# Patient Record
Sex: Male | Born: 1950 | Race: White | Hispanic: No | Marital: Single | Smoking: Former smoker
Health system: Southern US, Community
[De-identification: ages and names within clinical notes are randomized; demographics above are authoritative.]

## PROBLEM LIST (undated history)

## (undated) DIAGNOSIS — F0390 Unspecified dementia without behavioral disturbance: Secondary | ICD-10-CM

## (undated) DIAGNOSIS — I10 Essential (primary) hypertension: Secondary | ICD-10-CM

## (undated) DIAGNOSIS — I4892 Unspecified atrial flutter: Secondary | ICD-10-CM

## (undated) DIAGNOSIS — E119 Type 2 diabetes mellitus without complications: Secondary | ICD-10-CM

## (undated) DIAGNOSIS — F419 Anxiety disorder, unspecified: Secondary | ICD-10-CM

## (undated) DIAGNOSIS — G4700 Insomnia, unspecified: Secondary | ICD-10-CM

## (undated) DIAGNOSIS — C4491 Basal cell carcinoma of skin, unspecified: Secondary | ICD-10-CM

## (undated) DIAGNOSIS — R41841 Cognitive communication deficit: Secondary | ICD-10-CM

## (undated) DIAGNOSIS — I2510 Atherosclerotic heart disease of native coronary artery without angina pectoris: Secondary | ICD-10-CM

---

## 2013-01-13 ENCOUNTER — Other Ambulatory Visit: Payer: Self-pay

## 2013-01-13 LAB — URINALYSIS, COMPLETE
Bilirubin,UR: NEGATIVE
Blood: NEGATIVE
Glucose,UR: 150 mg/dL (ref 0–75)
Ketone: NEGATIVE
Nitrite: NEGATIVE
RBC,UR: 2 /HPF (ref 0–5)
Specific Gravity: 1.019 (ref 1.003–1.030)
WBC UR: 3 /HPF (ref 0–5)

## 2013-01-14 LAB — URINE CULTURE

## 2013-06-29 ENCOUNTER — Other Ambulatory Visit: Payer: Self-pay | Admitting: Family Medicine

## 2013-06-29 LAB — COMPREHENSIVE METABOLIC PANEL
Albumin: 3.2 g/dL — ABNORMAL LOW (ref 3.4–5.0)
BUN: 21 mg/dL — ABNORMAL HIGH (ref 7–18)
Calcium, Total: 8.8 mg/dL (ref 8.5–10.1)
Co2: 28 mmol/L (ref 21–32)
Creatinine: 1.21 mg/dL (ref 0.60–1.30)
EGFR (Non-African Amer.): >60
Osmolality: 288 (ref 275–301)
Potassium: 3.8 mmol/L (ref 3.5–5.1)
SGOT(AST): 17 U/L (ref 15–37)
Sodium: 141 mmol/L (ref 136–145)
Total Protein: 6.2 g/dL — ABNORMAL LOW (ref 6.4–8.2)

## 2013-06-29 LAB — CBC WITH DIFFERENTIAL/PLATELET
Basophil #: 0.1 10*3/uL (ref 0.0–0.1)
Eosinophil %: 2.4 %
HCT: 39.4 % — ABNORMAL LOW (ref 40.0–52.0)
HGB: 13.4 g/dL (ref 13.0–18.0)
Lymphocyte #: 2.8 10*3/uL (ref 1.0–3.6)
Lymphocyte %: 29.2 %
MCH: 31.4 pg (ref 26.0–34.0)
MCHC: 33.9 g/dL (ref 32.0–36.0)
MCV: 93 fL (ref 80–100)
Monocyte #: 1.3 x10 3/mm — ABNORMAL HIGH (ref 0.2–1.0)
Neutrophil #: 5.1 10*3/uL (ref 1.4–6.5)
Platelet: 230 10*3/uL (ref 150–440)
RBC: 4.26 10*6/uL — ABNORMAL LOW (ref 4.40–5.90)
RDW: 15.2 % — ABNORMAL HIGH (ref 11.5–14.5)
WBC: 9.5 10*3/uL (ref 3.8–10.6)

## 2013-06-29 LAB — PROTIME-INR: INR: 1

## 2015-07-05 ENCOUNTER — Other Ambulatory Visit
Admission: RE | Admit: 2015-07-05 | Discharge: 2015-07-05 | Disposition: A | Discharge status: Home / Self Care | Payer: Medicare Other | Source: Ambulatory Visit | Attending: Physician Assistant | Admitting: Physician Assistant

## 2015-07-05 DIAGNOSIS — R8290 Unspecified abnormal findings in urine: Secondary | ICD-10-CM | POA: Insufficient documentation

## 2015-07-05 DIAGNOSIS — I10 Essential (primary) hypertension: Secondary | ICD-10-CM | POA: Diagnosis not present

## 2015-07-05 DIAGNOSIS — Q245 Malformation of coronary vessels: Secondary | ICD-10-CM | POA: Insufficient documentation

## 2015-07-05 DIAGNOSIS — E119 Type 2 diabetes mellitus without complications: Secondary | ICD-10-CM | POA: Insufficient documentation

## 2015-07-05 DIAGNOSIS — F0150 Vascular dementia without behavioral disturbance: Secondary | ICD-10-CM | POA: Insufficient documentation

## 2015-07-05 LAB — URINALYSIS COMPLETE WITH MICROSCOPIC (ARMC ONLY)
Bilirubin Urine: NEGATIVE
Glucose, UA: NEGATIVE mg/dL
Hgb urine dipstick: NEGATIVE
Ketones, ur: NEGATIVE mg/dL
Nitrite: NEGATIVE
PROTEIN: NEGATIVE mg/dL
Specific Gravity, Urine: 1.016 (ref 1.005–1.030)
Squamous Epithelial / LPF: NONE SEEN
pH: 6 (ref 5.0–8.0)

## 2015-07-07 LAB — URINE CULTURE: Culture: >=100000

## 2015-08-31 ENCOUNTER — Other Ambulatory Visit
Admission: RE | Admit: 2015-08-31 | Discharge: 2015-08-31 | Disposition: A | Discharge status: Home / Self Care | Payer: Medicare Other | Source: Ambulatory Visit | Attending: Family Medicine | Admitting: Family Medicine

## 2015-08-31 DIAGNOSIS — N390 Urinary tract infection, site not specified: Secondary | ICD-10-CM | POA: Insufficient documentation

## 2015-08-31 LAB — URINALYSIS COMPLETE WITH MICROSCOPIC (ARMC ONLY)
BILIRUBIN URINE: NEGATIVE
Glucose, UA: NEGATIVE mg/dL
KETONES UR: NEGATIVE mg/dL
NITRITE: POSITIVE — AB
PROTEIN: NEGATIVE mg/dL
SPECIFIC GRAVITY, URINE: 1.017 (ref 1.005–1.030)
pH: 6 (ref 5.0–8.0)

## 2015-09-02 LAB — URINE CULTURE: Culture: >=100000

## 2016-02-18 ENCOUNTER — Emergency Department: Payer: Medicare Other

## 2016-02-18 ENCOUNTER — Emergency Department
Admission: EM | Admit: 2016-02-18 | Discharge: 2016-02-18 | Disposition: A | Discharge status: Skilled Nursing Facility | Payer: Medicare Other | Attending: Emergency Medicine | Admitting: Emergency Medicine

## 2016-02-18 DIAGNOSIS — Y939 Activity, unspecified: Secondary | ICD-10-CM | POA: Diagnosis not present

## 2016-02-18 DIAGNOSIS — S5011XA Contusion of right forearm, initial encounter: Secondary | ICD-10-CM | POA: Insufficient documentation

## 2016-02-18 DIAGNOSIS — W19XXXA Unspecified fall, initial encounter: Secondary | ICD-10-CM | POA: Insufficient documentation

## 2016-02-18 DIAGNOSIS — S51811A Laceration without foreign body of right forearm, initial encounter: Secondary | ICD-10-CM | POA: Diagnosis present

## 2016-02-18 DIAGNOSIS — Y999 Unspecified external cause status: Secondary | ICD-10-CM | POA: Insufficient documentation

## 2016-02-18 DIAGNOSIS — Y92128 Other place in nursing home as the place of occurrence of the external cause: Secondary | ICD-10-CM | POA: Insufficient documentation

## 2016-02-18 NOTE — ED Notes (Signed)
Ems brought pt in after fall at the nursing home - the nursing home reports that pt fell and hit right arm on a dresser causing a large skin tear to right fa that they felt needed to be evaluated - MD Jimmye Norman unwrapped and  assessed area - requested it be rewrapped with and pt be xrayed

## 2016-02-18 NOTE — ED Notes (Signed)
Per ems the nursing home ([facility]) reported that pt fell and hit right arm on dresser causing a "significant" skin tear that the nurse wanted to be evaluated - the rt fa is bandaged and no drainage or blood is noted on the outer aspect of dressing - discussed with physician here and will leave area dressed until he is ready to evaluate

## 2016-02-18 NOTE — Discharge Instructions (Signed)
°  Skin Tear Care A skin tear is a wound in which the top layer of skin has peeled off. This is a common problem with aging because the skin becomes thinner and more fragile as a person gets older. In addition, some medicines, such as oral corticosteroids, can lead to skin thinning if taken for long periods of time.  A skin tear is often repaired with tape or skin adhesive strips. This keeps the skin that has been peeled off in contact with the healthier skin beneath. Depending on the location of the wound, a bandage (dressing) may be applied over the tape or skin adhesive strips. Sometimes, during the healing process, the skin turns black and dies. Even when this happens, the torn skin acts as a good dressing until the skin underneath gets healthier and repairs itself. HOME CARE INSTRUCTIONS   Change dressings once per day or as directed by your caregiver.  Gently clean the skin tear and the area around the tear using saline solution or mild soap and water.  Do not rub the injured skin dry. Let the area air dry.  Apply petroleum jelly or an antibiotic cream or ointment to keep the tear moist. This will help the wound heal. Do not allow a scab to form.  If the dressing sticks before the next dressing change, moisten it with warm soapy water and gently remove it.  Protect the injured skin until it has healed.  Only take over-the-counter or prescription medicines as directed by your caregiver.  Take showers or baths using warm soapy water. Apply a new dressing after the shower or bath.  Keep all follow-up appointments as directed by your caregiver.  SEEK IMMEDIATE MEDICAL CARE IF:   You have redness, swelling, or increasing pain in the skin tear.  You havepus coming from the skin tear.  You have chills.  You have a red streak that goes away from the skin tear.  You have a bad smell coming from the tear or dressing.  You have a fever or persistent symptoms for more than 2-3  days.  You have a fever and your symptoms suddenly get worse. MAKE SURE YOU:  Understand these instructions.  Will watch this condition.  Will get help right away if your child is not doing well or gets worse.   This information is not intended to replace advice given to you by your health care provider. Make sure you discuss any questions you have with your health care provider.   Document Released: 07/13/2001 Document Revised: 07/12/2012 Document Reviewed: 05/01/2012 Elsevier Interactive Patient Education [employer].

## 2016-02-18 NOTE — ED Notes (Signed)
[facility]ard secretary notified that pt is ready to return to [facility]

## 2016-02-18 NOTE — ED Provider Notes (Addendum)
[facility] Emergency Department Provider Note     Time seen: ----------------------------------------- 6:09 PM on 02/18/2016 -----------------------------------------  L5 caveat: Review of systems and history are limited by dementia  I have reviewed the triage vital signs and the nursing notes.   HISTORY  Chief Complaint Fall    HPI Joel Davis is a 65 y.o. male who presents ER after he fell on his right arm on a dresser at the nursing home. Patient was noted to have a significant skin tear in the right forearm and the nurse wanted to be evaluated. Patient denies any complaints, is demented.   No past medical history on file.  There are no active problems to display for this patient.   No past surgical history on file.  Allergies Review of patient's allergies indicates not on file.  Social History Social History  Substance Use Topics  . Smoking status: Not on file  . Smokeless tobacco: Not on file  . Alcohol Use: Not on file    Review of Systems Unknown, patient denies complaints  ____________________________________________   PHYSICAL EXAM:  VITAL SIGNS: ED Triage Vitals  Enc Vitals Group     BP 02/18/16 1805 170/70 mmHg     Pulse Rate 02/18/16 1805 72     Resp 02/18/16 1805 20     Temp 02/18/16 1805 97.8 F (36.6 C)     Temp Source 02/18/16 1805 Oral     SpO2 02/18/16 1805 99 %     Weight 02/18/16 1805 160 lb (72.576 kg)     Height 02/18/16 1805 5\' 11"  (1.803 m)     Head Cir --      Peak Flow --      Pain Score --      Pain Loc --      Pain Edu? --      Excl. in [place]? --    Constitutional: Alert, and in no distress. Eyes: Conjunctivae are normal. Normal extraocular movements. ENT   Head: Normocephalic and atraumatic.   Nose: No congestion/rhinnorhea.   Mouth/Throat: Mucous membranes are moist.   Neck: No stridor. Cardiovascular: Normal rate, regular rhythm. No murmurs, rubs, or gallops. Respiratory:  Normal respiratory effort without tachypnea nor retractions. Breath sounds are clear and equal bilaterally. No wheezes/rales/rhonchi. Musculoskeletal: Ecchymosis is noted over the dorsal aspect of the right forearm, extensive skin tears noted over the dorsomedial right forearm. Neurologic: No gross focal neurologic deficits are appreciated.  Skin:  Skin tear and ecchymosis is noted over the right forearm ____________________________________________  ED COURSE:  Pertinent labs & imaging results that were available during my care of the patient were reviewed by me and considered in my medical decision making (see chart for details). Patient is in no acute distress, Xeroform dressing was applied. We will obtain x-rays and likely discharged to return to the nursing home. ____________________________________________    RADIOLOGY Images were viewed by me  Right forearm x-rays are unremarkable  IMPRESSION: No acute bony findings or radiopaque foreign body. ____________________________________________  FINAL ASSESSMENT AND PLAN  Fall, contusion, skin tear  Plan: Patient with imaging as dictated above. Patient is in no acute distress, a wound dressing was applied. X-rays were negative as dictated above. He stable for discharge.   Earleen Newport, MD   Earleen Newport, MD 02/18/16 1812  Earleen Newport, MD 02/18/16 [phone]

## 2016-12-06 ENCOUNTER — Other Ambulatory Visit
Admission: RE | Admit: 2016-12-06 | Discharge: 2016-12-06 | Disposition: A | Discharge status: Home / Self Care | Payer: Self-pay | Source: Ambulatory Visit | Attending: Family Medicine | Admitting: Family Medicine

## 2016-12-06 DIAGNOSIS — I10 Essential (primary) hypertension: Secondary | ICD-10-CM | POA: Insufficient documentation

## 2016-12-06 DIAGNOSIS — R531 Weakness: Secondary | ICD-10-CM | POA: Insufficient documentation

## 2016-12-06 LAB — CBC WITH DIFFERENTIAL/PLATELET
BASOS ABS: 0.1 10*3/uL (ref 0–0.1)
Basophils Relative: 1 %
EOS ABS: 0 10*3/uL (ref 0–0.7)
Eosinophils Relative: 0 %
HCT: 41 % (ref 40.0–52.0)
Hemoglobin: 13.5 g/dL (ref 13.0–18.0)
LYMPHS ABS: 3.2 10*3/uL (ref 1.0–3.6)
LYMPHS PCT: 24 %
MCH: 31.6 pg (ref 26.0–34.0)
MCHC: 32.9 g/dL (ref 32.0–36.0)
MCV: 96.2 fL (ref 80.0–100.0)
MONO ABS: 1.5 10*3/uL — AB (ref 0.2–1.0)
Monocytes Relative: 11 %
NEUTROS ABS: 8.4 10*3/uL — AB (ref 1.4–6.5)
Neutrophils Relative %: 64 %
PLATELETS: 310 10*3/uL (ref 150–440)
RBC: 4.26 MIL/uL — ABNORMAL LOW (ref 4.40–5.90)
RDW: 14.9 % — ABNORMAL HIGH (ref 11.5–14.5)
Smear Review: ADEQUATE
WBC: 13.2 10*3/uL — ABNORMAL HIGH (ref 3.8–10.6)

## 2016-12-06 LAB — COMPREHENSIVE METABOLIC PANEL
ALT: 13 U/L — AB (ref 17–63)
AST: 31 U/L (ref 15–41)
Albumin: 3.6 g/dL (ref 3.5–5.0)
Alkaline Phosphatase: 72 U/L (ref 38–126)
Anion gap: 12 (ref 5–15)
BUN: 40 mg/dL — ABNORMAL HIGH (ref 6–20)
CHLORIDE: 109 mmol/L (ref 101–111)
CO2: 22 mmol/L (ref 22–32)
CREATININE: 1.84 mg/dL — AB (ref 0.61–1.24)
Calcium: 9.4 mg/dL (ref 8.9–10.3)
GFR, EST AFRICAN AMERICAN: 42 mL/min — AB (ref >60)
GFR, EST NON AFRICAN AMERICAN: 36 mL/min — AB (ref >60)
Glucose, Bld: 145 mg/dL — ABNORMAL HIGH (ref 65–99)
Potassium: 4.4 mmol/L (ref 3.5–5.1)
SODIUM: 143 mmol/L (ref 135–145)
Total Bilirubin: 0.4 mg/dL (ref 0.3–1.2)
Total Protein: 7 g/dL (ref 6.5–8.1)

## 2016-12-16 ENCOUNTER — Encounter: Payer: Self-pay | Admitting: Emergency Medicine

## 2016-12-16 ENCOUNTER — Inpatient Hospital Stay
Admission: EM | Admit: 2016-12-16 | Discharge: 2016-12-19 | DRG: 689 | Disposition: A | Discharge status: Skilled Nursing Facility | Payer: Medicare Other | Attending: Specialist | Admitting: Specialist

## 2016-12-16 ENCOUNTER — Emergency Department: Payer: Medicare Other

## 2016-12-16 DIAGNOSIS — F419 Anxiety disorder, unspecified: Secondary | ICD-10-CM | POA: Diagnosis present

## 2016-12-16 DIAGNOSIS — E119 Type 2 diabetes mellitus without complications: Secondary | ICD-10-CM | POA: Diagnosis present

## 2016-12-16 DIAGNOSIS — I2510 Atherosclerotic heart disease of native coronary artery without angina pectoris: Secondary | ICD-10-CM | POA: Diagnosis present

## 2016-12-16 DIAGNOSIS — F0150 Vascular dementia without behavioral disturbance: Secondary | ICD-10-CM | POA: Diagnosis present

## 2016-12-16 DIAGNOSIS — R4182 Altered mental status, unspecified: Secondary | ICD-10-CM | POA: Diagnosis not present

## 2016-12-16 DIAGNOSIS — N390 Urinary tract infection, site not specified: Principal | ICD-10-CM | POA: Diagnosis present

## 2016-12-16 DIAGNOSIS — B9620 Unspecified Escherichia coli [E. coli] as the cause of diseases classified elsewhere: Secondary | ICD-10-CM | POA: Diagnosis present

## 2016-12-16 DIAGNOSIS — Z79899 Other long term (current) drug therapy: Secondary | ICD-10-CM | POA: Diagnosis not present

## 2016-12-16 DIAGNOSIS — I10 Essential (primary) hypertension: Secondary | ICD-10-CM | POA: Diagnosis present

## 2016-12-16 DIAGNOSIS — N179 Acute kidney failure, unspecified: Secondary | ICD-10-CM | POA: Diagnosis present

## 2016-12-16 DIAGNOSIS — Z85828 Personal history of other malignant neoplasm of skin: Secondary | ICD-10-CM | POA: Diagnosis not present

## 2016-12-16 DIAGNOSIS — Z87891 Personal history of nicotine dependence: Secondary | ICD-10-CM

## 2016-12-16 DIAGNOSIS — R41841 Cognitive communication deficit: Secondary | ICD-10-CM | POA: Diagnosis present

## 2016-12-16 DIAGNOSIS — E870 Hyperosmolality and hypernatremia: Secondary | ICD-10-CM | POA: Diagnosis present

## 2016-12-16 DIAGNOSIS — G4700 Insomnia, unspecified: Secondary | ICD-10-CM | POA: Diagnosis present

## 2016-12-16 DIAGNOSIS — M6281 Muscle weakness (generalized): Secondary | ICD-10-CM

## 2016-12-16 DIAGNOSIS — I4892 Unspecified atrial flutter: Secondary | ICD-10-CM | POA: Diagnosis present

## 2016-12-16 DIAGNOSIS — Z8673 Personal history of transient ischemic attack (TIA), and cerebral infarction without residual deficits: Secondary | ICD-10-CM | POA: Diagnosis not present

## 2016-12-16 DIAGNOSIS — E860 Dehydration: Secondary | ICD-10-CM | POA: Diagnosis present

## 2016-12-16 DIAGNOSIS — E785 Hyperlipidemia, unspecified: Secondary | ICD-10-CM | POA: Diagnosis present

## 2016-12-16 DIAGNOSIS — G9341 Metabolic encephalopathy: Secondary | ICD-10-CM | POA: Diagnosis present

## 2016-12-16 DIAGNOSIS — F329 Major depressive disorder, single episode, unspecified: Secondary | ICD-10-CM | POA: Diagnosis present

## 2016-12-16 DIAGNOSIS — Z7982 Long term (current) use of aspirin: Secondary | ICD-10-CM | POA: Diagnosis not present

## 2016-12-16 DIAGNOSIS — N342 Other urethritis: Secondary | ICD-10-CM

## 2016-12-16 HISTORY — DX: Unspecified atrial flutter: I48.92

## 2016-12-16 HISTORY — DX: Essential (primary) hypertension: I10

## 2016-12-16 HISTORY — DX: Insomnia, unspecified: G47.00

## 2016-12-16 HISTORY — DX: Cognitive communication deficit: R41.841

## 2016-12-16 HISTORY — DX: Basal cell carcinoma of skin, unspecified: C44.91

## 2016-12-16 HISTORY — DX: Anxiety disorder, unspecified: F41.9

## 2016-12-16 HISTORY — DX: Atherosclerotic heart disease of native coronary artery without angina pectoris: I25.10

## 2016-12-16 HISTORY — DX: Type 2 diabetes mellitus without complications: E11.9

## 2016-12-16 HISTORY — DX: Unspecified dementia, unspecified severity, without behavioral disturbance, psychotic disturbance, mood disturbance, and anxiety: F03.90

## 2016-12-16 LAB — URINALYSIS, COMPLETE (UACMP) WITH MICROSCOPIC
Bilirubin Urine: NEGATIVE
GLUCOSE, UA: NEGATIVE mg/dL
Ketones, ur: NEGATIVE mg/dL
NITRITE: POSITIVE — AB
Protein, ur: NEGATIVE mg/dL
Specific Gravity, Urine: 1.017 (ref 1.005–1.030)
pH: 5 (ref 5.0–8.0)

## 2016-12-16 LAB — CBC WITH DIFFERENTIAL/PLATELET
BASOS ABS: 0.1 10*3/uL (ref 0–0.1)
Basophils Relative: 1 %
Eosinophils Absolute: 0.3 10*3/uL (ref 0–0.7)
Eosinophils Relative: 3 %
HEMATOCRIT: 37.9 % — AB (ref 40.0–52.0)
Hemoglobin: 12.8 g/dL — ABNORMAL LOW (ref 13.0–18.0)
LYMPHS ABS: 2.5 10*3/uL (ref 1.0–3.6)
LYMPHS PCT: 22 %
MCH: 32.5 pg (ref 26.0–34.0)
MCHC: 33.7 g/dL (ref 32.0–36.0)
MCV: 96.3 fL (ref 80.0–100.0)
MONOS PCT: 12 %
Monocytes Absolute: 1.4 10*3/uL — ABNORMAL HIGH (ref 0.2–1.0)
NEUTROS ABS: 7.1 10*3/uL — AB (ref 1.4–6.5)
Neutrophils Relative %: 62 %
Platelets: 218 10*3/uL (ref 150–440)
RBC: 3.94 MIL/uL — ABNORMAL LOW (ref 4.40–5.90)
RDW: 15.1 % — AB (ref 11.5–14.5)
WBC: 11.4 10*3/uL — ABNORMAL HIGH (ref 3.8–10.6)

## 2016-12-16 LAB — COMPREHENSIVE METABOLIC PANEL
ALT: 11 U/L — ABNORMAL LOW (ref 17–63)
AST: 18 U/L (ref 15–41)
Albumin: 3.7 g/dL (ref 3.5–5.0)
Alkaline Phosphatase: 70 U/L (ref 38–126)
Anion gap: 8 (ref 5–15)
BILIRUBIN TOTAL: 0.5 mg/dL (ref 0.3–1.2)
BUN: 55 mg/dL — ABNORMAL HIGH (ref 6–20)
CO2: 26 mmol/L (ref 22–32)
Calcium: 9 mg/dL (ref 8.9–10.3)
Chloride: 112 mmol/L — ABNORMAL HIGH (ref 101–111)
Creatinine, Ser: 1.85 mg/dL — ABNORMAL HIGH (ref 0.61–1.24)
GFR calc Af Amer: 41 mL/min — ABNORMAL LOW (ref >60)
GFR, EST NON AFRICAN AMERICAN: 36 mL/min — AB (ref >60)
Glucose, Bld: 125 mg/dL — ABNORMAL HIGH (ref 65–99)
Potassium: 4.7 mmol/L (ref 3.5–5.1)
Sodium: 146 mmol/L — ABNORMAL HIGH (ref 135–145)
TOTAL PROTEIN: 7.2 g/dL (ref 6.5–8.1)

## 2016-12-16 LAB — PROTIME-INR
INR: 1.06
PROTHROMBIN TIME: 13.8 s (ref 11.4–15.2)

## 2016-12-16 MED ORDER — ENOXAPARIN SODIUM 40 MG/0.4ML ~~LOC~~ SOLN
40.0000 mg | SUBCUTANEOUS | Status: DC
  Administered 2016-12-16 – 2016-12-18 (×3): 40 mg via SUBCUTANEOUS
  Filled 2016-12-16 (×3): qty 0.4

## 2016-12-16 MED ORDER — SODIUM CHLORIDE 0.9 % IV BOLUS (SEPSIS)
500.0000 mL | Freq: Once | INTRAVENOUS | Status: AC
  Administered 2016-12-16: 500 mL via INTRAVENOUS

## 2016-12-16 MED ORDER — POLYETHYLENE GLYCOL 3350 17 G PO PACK: 17.0000 g | PACK | Freq: Every day | ORAL | Status: DC | PRN

## 2016-12-16 MED ORDER — ACETAMINOPHEN 650 MG RE SUPP: 650.0000 mg | Freq: Four times a day (QID) | RECTAL | Status: DC | PRN

## 2016-12-16 MED ORDER — ASPIRIN EC 81 MG PO TBEC
81.0000 mg | DELAYED_RELEASE_TABLET | Freq: Every day | ORAL | Status: DC
  Administered 2016-12-19: 10:00:00 81 mg via ORAL
  Filled 2016-12-16 (×3): qty 1

## 2016-12-16 MED ORDER — CYANOCOBALAMIN 500 MCG PO TABS
500.0000 ug | ORAL_TABLET | Freq: Every day | ORAL | Status: DC
  Administered 2016-12-19: 500 ug via ORAL
  Filled 2016-12-16 (×4): qty 1

## 2016-12-16 MED ORDER — SENNOSIDES-DOCUSATE SODIUM 8.6-50 MG PO TABS
1.0000 | ORAL_TABLET | Freq: Every day | ORAL | Status: DC
  Administered 2016-12-19: 1 via ORAL
  Filled 2016-12-16 (×3): qty 1

## 2016-12-16 MED ORDER — SODIUM CHLORIDE 0.9 % IV SOLN
INTRAVENOUS | Status: DC
  Administered 2016-12-16: 19:00:00 via INTRAVENOUS

## 2016-12-16 MED ORDER — METOPROLOL TARTRATE 25 MG PO TABS
25.0000 mg | ORAL_TABLET | Freq: Every day | ORAL | Status: DC
  Filled 2016-12-16 (×2): qty 1

## 2016-12-16 MED ORDER — ATORVASTATIN CALCIUM 20 MG PO TABS
80.0000 mg | ORAL_TABLET | Freq: Every evening | ORAL | Status: DC
  Filled 2016-12-16: qty 4

## 2016-12-16 MED ORDER — CITALOPRAM HYDROBROMIDE 20 MG PO TABS
20.0000 mg | ORAL_TABLET | Freq: Every day | ORAL | Status: DC
  Administered 2016-12-19: 20 mg via ORAL
  Filled 2016-12-16 (×3): qty 1

## 2016-12-16 MED ORDER — DEXTROSE 5 % IV SOLN: 1.0000 g | Freq: Once | INTRAVENOUS | Status: DC

## 2016-12-16 MED ORDER — DEXTROSE 5 % IV SOLN
1.0000 g | INTRAVENOUS | Status: DC
  Administered 2016-12-17 – 2016-12-19 (×3): 1 g via INTRAVENOUS
  Filled 2016-12-16 (×3): qty 10

## 2016-12-16 MED ORDER — ACETAMINOPHEN 325 MG PO TABS
650.0000 mg | ORAL_TABLET | Freq: Four times a day (QID) | ORAL | Status: DC | PRN
  Filled 2016-12-16: qty 2

## 2016-12-16 MED ORDER — CEFTRIAXONE SODIUM-DEXTROSE 1-3.74 GM-% IV SOLR
1.0000 g | Freq: Once | INTRAVENOUS | Status: AC
  Administered 2016-12-16: 1 g via INTRAVENOUS
  Filled 2016-12-16: qty 50

## 2016-12-16 MED ORDER — METOPROLOL TARTRATE 50 MG PO TABS
50.0000 mg | ORAL_TABLET | Freq: Every morning | ORAL | Status: DC
  Administered 2016-12-19: 50 mg via ORAL
  Filled 2016-12-16 (×3): qty 1

## 2016-12-16 MED ORDER — ONDANSETRON HCL 4 MG/2ML IJ SOLN: 4.0000 mg | Freq: Four times a day (QID) | INTRAMUSCULAR | Status: DC | PRN

## 2016-12-16 MED ORDER — ONDANSETRON HCL 4 MG PO TABS: 4.0000 mg | ORAL_TABLET | Freq: Four times a day (QID) | ORAL | Status: DC | PRN

## 2016-12-16 MED ORDER — OXYCODONE HCL 5 MG PO TABS: 5.0000 mg | ORAL_TABLET | ORAL | Status: DC | PRN

## 2016-12-16 NOTE — H&P (Signed)
[place] [facility] [place] NAME: Joel Davis    MR#:  [id number]  DATE OF BIRTH:  December 31, 1950  DATE OF ADMISSION:  12/16/2016  PRIMARY CARE PHYSICIAN: Alvester Morin, MD   REQUESTING/REFERRING PHYSICIAN: Dr. Shirlyn Goltz  CHIEF COMPLAINT:   Chief Complaint  Patient presents with  . Altered Mental Status    HISTORY OF PRESENT ILLNESS:  Joel Davis  is a 66 y.o. male with a known history of Multiple CVAs, vascular dementia, coronary artery disease, hypertension presents from [place] home secondary to altered mental status and fevers. Patient has been complaining of chills and fevers for the last day.. They have attempted to do a flu swab, however, significant epistaxis. He was sent to the emergency room. Here he is noted to be very confused than baseline. Labs indicate acute renal failure and significant urinary tract infection. Patient has an elevated white count as well. He is being admitted for cystitis and metabolic encephalopathy.  PAST MEDICAL HISTORY:   Past Medical History:  Diagnosis Date  . Anxiety   . Atrial flutter ([place])   . Basal cell carcinoma   . Cognitive communication deficit   . Coronary artery disease   . Dementia   . DM (diabetes mellitus) ([place])   . Hypertension   . Insomnia     PAST SURGICAL HISTORY:  No past surgical history on file.  SOCIAL HISTORY:   Social History  Substance Use Topics  . Smoking status: Former Research scientist (life sciences)  . Smokeless tobacco: Not on file  . Alcohol use No    FAMILY HISTORY:   Family History  Problem Relation Age of Onset  . Family history unknown: Yes    DRUG ALLERGIES:  No Known Allergies  REVIEW OF SYSTEMS:   Review of Systems  Unable to perform ROS: Mental status change    MEDICATIONS AT HOME:   Prior to Admission medications   Medication Sig Start Date End Date Taking? Authorizing Provider  aspirin EC 81 MG tablet Take 81 mg by mouth daily.    Yes Historical Provider, MD  atorvastatin (LIPITOR) 80 MG tablet Take 80 mg by mouth daily.   Yes Historical Provider, MD  chlorhexidine (PERIDEX) 0.12 % solution Use as directed 15 mLs in the mouth or throat daily.   Yes Historical Provider, MD  citalopram (CELEXA) 20 MG tablet Take 20 mg by mouth daily.   Yes Historical Provider, MD  hydrochlorothiazide (HYDRODIURIL) 12.5 MG tablet Take 12.5 mg by mouth daily.   Yes Historical Provider, MD  lisinopril (PRINIVIL,ZESTRIL) 40 MG tablet Take 40 mg by mouth daily.   Yes Historical Provider, MD  metoprolol (LOPRESSOR) 50 MG tablet Take 50 mg by mouth daily. Take 50 mg every morning.   Yes Historical Provider, MD  metoprolol tartrate (LOPRESSOR) 25 MG tablet Take 25 mg by mouth daily. Take 25 mg every evening.   Yes Historical Provider, MD  oxyCODONE (OXY IR/ROXICODONE) 5 MG immediate release tablet Take 5 mg by mouth every 4 (four) hours as needed for severe pain.   Yes Historical Provider, MD  sennosides-docusate sodium (SENOKOT-S) 8.6-50 MG tablet Take 1 tablet by mouth daily.   Yes Historical Provider, MD  vitamin B-12 (CYANOCOBALAMIN) 500 MCG tablet Take 500 mcg by mouth daily.   Yes Historical Provider, MD      VITAL SIGNS:  Blood pressure (!) 134/57, pulse 68, temperature 97.2 F (36.2 C), temperature source Rectal, resp. rate 17, height 5\' 9"  (  1.753 m), weight 77.1 kg (170 lb), SpO2 96 %.  PHYSICAL EXAMINATION:   Physical Exam  GENERAL:  66 y.o.-year-old patient lying in the bed with no acute distress. Appears weak, dishelved EYES: Pupils equal, round, reactive to light and accommodation. No scleral icterus. Extraocular muscles intact.  HEENT: Head atraumatic, normocephalic. Oropharynx clear. Lots of dried blood around the nose NECK:  Supple, no jugular venous distention. No thyroid enlargement, no tenderness.  LUNGS: Normal breath sounds bilaterally, no wheezing, rales,rhonchi or crepitation. No use of accessory muscles of  respiration. Decreased bibasilar breath sounds CARDIOVASCULAR: S1, S2 normal. No murmurs, rubs, or gallops.  ABDOMEN: Soft, nontender, nondistended. Bowel sounds present. No organomegaly or mass.  EXTREMITIES: No pedal edema, cyanosis, or clubbing.  NEUROLOGIC: Following simple commands, very slow to respond.Gait not checked. Sensation is intact PSYCHIATRIC: The patient is alert and but not oriented  SKIN: No obvious rash, lesion, or ulcer. Petechiae on abdomen, scrotal area noted.  LABORATORY PANEL:   CBC  Recent Labs Lab 12/16/16 1310  WBC 11.4*  HGB 12.8*  HCT 37.9*  PLT 218   ------------------------------------------------------------------------------------------------------------------  Chemistries   Recent Labs Lab 12/16/16 1310  NA 146*  K 4.7  CL 112*  CO2 26  GLUCOSE 125*  BUN 55*  CREATININE 1.85*  CALCIUM 9.0  AST 18  ALT 11*  ALKPHOS 70  BILITOT 0.5   ------------------------------------------------------------------------------------------------------------------  Cardiac Enzymes No results for input(s): TROPONINI in the last 168 hours. ------------------------------------------------------------------------------------------------------------------  RADIOLOGY:  Dg Chest 1 View  Result Date: 12/16/2016 CLINICAL DATA:  Epistaxis following swab in the nasal passages for the flu test. Increasing weakness and altered mental status over the past several days. History of coronary artery disease, atrial fibrillation-flutter. EXAM: CHEST 1 VIEW COMPARISON:  None in PACs FINDINGS: The lungs are hypoinflated. The interstitial markings are mildly prominent. There is no alveolar infiltrate or pleural effusion. The cardiac silhouette is enlarged. The pulmonary vascularity is normal. There is calcification in the wall of the aortic arch. There is subtle contour irregularity of the lateral aspect of the left fifth rib which may reflect a fracture. No bony abnormality  is observed elsewhere. IMPRESSION: Mild cardiomegaly.  No pulmonary edema.  No definite pneumonia. Thoracic aortic atherosclerosis. Possible acute or subacute fracture of the lateral aspect of the left fourth rib. Electronically Signed   By: David  Martinique M.D.   On: 12/16/2016 13:17   Ct Head Wo Contrast  Result Date: 12/16/2016 CLINICAL DATA:  Altered mental status EXAM: CT HEAD WITHOUT CONTRAST TECHNIQUE: Contiguous axial images were obtained from the base of the skull through the vertex without intravenous contrast. COMPARISON:  None. FINDINGS: Brain: No evidence of acute infarction, hemorrhage, hydrocephalus, extra-axial collection or mass lesion/mass effect. Well-defined and low-density infarcts seen in the right occipital lobe, left superior cerebellum, and posterior left frontal lobe. Remote appearing lacunar infarcts in the bilateral medial thalamus and putamen. Dystrophic calcification in the right occipital infarct. Vascular: Atherosclerotic calcification. Skull: No acute or aggressive finding Sinuses/Orbits: No acute finding IMPRESSION: 1. No acute finding. 2. 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 Electronically Signed   By: Monte Fantasia M.D.   On: 12/16/2016 13:07    EKG:  No orders found for this or any previous visit.  IMPRESSION AND PLAN:   Amahd Brakeman  is a 66 y.o. male with a known history of Multiple CVAs, vascular dementia, coronary artery disease, hypertension presents from  [facility] nursing home secondary to altered mental status and fevers.   #1 Acute cystitis- admit, urine cultures, blood cultures - started rocephin  #2 Metabolic encephalopathy- worse than baseline. - at baseline, he is oriented to self.  - secondary to infection. Monitor  #3 Hypernatremia- dehydration, IV fluids  #4 Epistaxis- traumatic, after attempting a flu swab at the nursing home -  stopped at this time. Monitor hb  #5 ARF- unknown baseline, both BUN/cr elevated - IV fluids, hold lisinopril/hctz and monitor  #6 Hypertension- on metoprolol, hold lisinopril/HCTZ  #7 DVT Prophylaxis-   Education officer, museum and PT consulted. Sister would like an update tomorrow.   All the records are reviewed and case discussed with ED provider. Management plans discussed with the patient, family and they are in agreement.  CODE STATUS: Full Code  TOTAL TIME TAKING CARE OF THIS PATIENT: 50 minutes.    Gladstone Lighter M.D on 12/16/2016 at 2:55 PM  Between 7am to 6pm - Pager - [phone]  After 6pm go to [email or web address] - password [email or web address] [facility]  Office  [phone]  CC: Primary care physician; Alvester Morin, MD

## 2016-12-16 NOTE — ED Triage Notes (Signed)
Patient presents to the ED via EMS from [facility].  Patient presents with large blood clots surrounding his nose.  Per EMS nose began to bleed after nursing home staff attempted to swab patient for the flu.  Patient has had increasing weakness and altered mental status over the past few days.  Patient has skin tear to his left arm that was bandaged by facility.  Nose is not actively bleeding at this time.  Patient's baseline per EMS is to self and patient is verbal.  Patient is non-verbal at this time.  Eyes are opening and closing spontaneously.

## 2016-12-16 NOTE — Consult Note (Signed)
Pharmacy Antibiotic Note  Joel Davis is a 66 y.o. male admitted on 12/16/2016 with UTI.  Pharmacy has been consulted for ceftriaxone dosing. Pt presents w/ AMS and fever, unable to test for flu due to epistaxis  Plan: ceftriaxone 1g q 24 hours  Height: 5\' 9"  (175.3 cm) Weight: 170 lb (77.1 kg) IBW/kg (Calculated) : 70.7  Temp (24hrs), Avg:97.2 F (36.2 C), Min:97.2 F (36.2 C), Max:97.2 F (36.2 C)   Recent Labs Lab 12/16/16 1310  WBC 11.4*  CREATININE 1.85*    Estimated Creatinine Clearance: 38.2 mL/min (by C-G formula based on SCr of 1.85 mg/dL (H)).    No Known Allergies  Antimicrobials this admission: ceftriaxone 2/15 >>    Dose adjustments this admission:   Microbiology results:  2/15 UCx:  UA: many bacteria, nitrate +, large leukocytes, 6-30 WBC   Thank you for allowing pharmacy to be a part of this patient's care.  Ramond Dial, Pharm.D, BCPS Clinical Pharmacist  12/16/2016 4:02 PM

## 2016-12-16 NOTE — ED Provider Notes (Signed)
[facility] Provider Note   CSN: [id number] Arrival date & time: 12/16/16  1228     History   Chief Complaint Chief Complaint  Patient presents with  . Altered Mental Status    HPI Joel Davis is a 66 y.o. male hx of aflutter, dementia, Lives in the nursing home. Presenting with altered mental status, weakness, chills. Patient is from [facility] and has been very altered and confused the last several days. At baseline he was able to ambulate very well and able to converse but he has been nonverbal for several days. Patient had a flu swab earlier today and that caused him to have nosebleed. Denies any trauma at the facility. Denies any history of bleeding problems. Patient is not on any blood thinners.  The history is provided by the patient.   Level V caveat- dementia   Past Medical History:  Diagnosis Date  . Anxiety   . Atrial flutter ([place])   . Basal cell carcinoma   . Cognitive communication deficit   . Coronary artery disease   . Dementia   . DM (diabetes mellitus) ([place])   . Hypertension   . Insomnia     There are no active problems to display for this patient.   No past surgical history on file.     Home Medications    Prior to Admission medications   Medication Sig Start Date End Date Taking? Authorizing Provider  aspirin EC 81 MG tablet Take 81 mg by mouth daily.   Yes Historical Provider, MD  atorvastatin (LIPITOR) 80 MG tablet Take 80 mg by mouth daily.   Yes Historical Provider, MD  chlorhexidine (PERIDEX) 0.12 % solution Use as directed 15 mLs in the mouth or throat daily.   Yes Historical Provider, MD  citalopram (CELEXA) 20 MG tablet Take 20 mg by mouth daily.   Yes Historical Provider, MD  hydrochlorothiazide (HYDRODIURIL) 12.5 MG tablet Take 12.5 mg by mouth daily.   Yes Historical Provider, MD  lisinopril (PRINIVIL,ZESTRIL) 40 MG tablet Take 40 mg by mouth daily.   Yes Historical Provider, MD  metoprolol (LOPRESSOR) 50 MG tablet  Take 50 mg by mouth daily. Take 50 mg every morning.   Yes Historical Provider, MD  metoprolol tartrate (LOPRESSOR) 25 MG tablet Take 25 mg by mouth daily. Take 25 mg every evening.   Yes Historical Provider, MD  oxyCODONE (OXY IR/ROXICODONE) 5 MG immediate release tablet Take 5 mg by mouth every 4 (four) hours as needed for severe pain.   Yes Historical Provider, MD  sennosides-docusate sodium (SENOKOT-S) 8.6-50 MG tablet Take 1 tablet by mouth daily.   Yes Historical Provider, MD  vitamin B-12 (CYANOCOBALAMIN) 500 MCG tablet Take 500 mcg by mouth daily.   Yes Historical Provider, MD    Family History Family History  Problem Relation Age of Onset  . Family history unknown: Yes    Social History Social History  Substance Use Topics  . Smoking status: Former Research scientist (life sciences)  . Smokeless tobacco: Not on file  . Alcohol use No     Allergies   Patient has no known allergies.   Review of Systems Review of Systems  Neurological: Positive for weakness.  All other systems reviewed and are negative.    Physical Exam Updated Vital Signs BP (!) 138/57   Pulse 63   Temp 97.2 F (36.2 C) (Rectal)   Resp 16   Ht 5\' 9"  (1.753 m)   Wt 170 lb (77.1 kg)   SpO2 100%  BMI 25.10 kg/m   Physical Exam  Constitutional:  Confused   HENT:  Head: Normocephalic.  Dry blood bilateral nostrils   Eyes: Pupils are equal, round, and reactive to light.  Neck: Normal range of motion. Neck supple.  Cardiovascular: Normal rate, regular rhythm and normal heart sounds.   Pulmonary/Chest: Effort normal and breath sounds normal. No respiratory distress. He has no wheezes. He has no rales.  Abdominal: Soft. Bowel sounds are normal. He exhibits no distension. There is no tenderness.  Musculoskeletal: Normal range of motion.  Neurological: He is alert.  A &O x 1. Moving all extremities   Skin: Skin is warm.  Petechiae throughout, no purpura   Psychiatric:  Unable   Nursing note and vitals  reviewed.    ED Treatments / Results  Labs (all labs ordered are listed, but only abnormal results are displayed) Labs Reviewed  CBC WITH DIFFERENTIAL/PLATELET - Abnormal; Notable for the following:       Result Value   WBC 11.4 (*)    RBC 3.94 (*)    Hemoglobin 12.8 (*)    HCT 37.9 (*)    RDW 15.1 (*)    Neutro Abs 7.1 (*)    Monocytes Absolute 1.4 (*)    All other components within normal limits  COMPREHENSIVE METABOLIC PANEL - Abnormal; Notable for the following:    Sodium 146 (*)    Chloride 112 (*)    Glucose, Bld 125 (*)    BUN 55 (*)    Creatinine, Ser 1.85 (*)    ALT 11 (*)    GFR calc non Af Amer 36 (*)    GFR calc Af Amer 41 (*)    All other components within normal limits  URINALYSIS, COMPLETE (UACMP) WITH MICROSCOPIC - Abnormal; Notable for the following:    Color, Urine YELLOW (*)    APPearance HAZY (*)    Hgb urine dipstick SMALL (*)    Nitrite POSITIVE (*)    Leukocytes, UA LARGE (*)    Bacteria, UA MANY (*)    Squamous Epithelial / LPF 0-5 (*)    All other components within normal limits  URINE CULTURE  PROTIME-INR    EKG  EKG Interpretation None       Radiology Dg Chest 1 View  Result Date: 12/16/2016 CLINICAL DATA:  Epistaxis following swab in the nasal passages for the flu test. Increasing weakness and altered mental status over the past several days. History of coronary artery disease, atrial fibrillation-flutter. EXAM: CHEST 1 VIEW COMPARISON:  None in PACs FINDINGS: The lungs are hypoinflated. The interstitial markings are mildly prominent. There is no alveolar infiltrate or pleural effusion. The cardiac silhouette is enlarged. The pulmonary vascularity is normal. There is calcification in the wall of the aortic arch. There is subtle contour irregularity of the lateral aspect of the left fifth rib which may reflect a fracture. No bony abnormality is observed elsewhere. IMPRESSION: Mild cardiomegaly.  No pulmonary edema.  No definite pneumonia.  Thoracic aortic atherosclerosis. Possible acute or subacute fracture of the lateral aspect of the left fourth rib. Electronically Signed   By: David  Martinique M.D.   On: 12/16/2016 13:17   Ct Head Wo Contrast  Result Date: 12/16/2016 CLINICAL DATA:  Altered mental status EXAM: CT HEAD WITHOUT CONTRAST TECHNIQUE: Contiguous axial images were obtained from the base of the skull through the vertex without intravenous contrast. COMPARISON:  None. FINDINGS: Brain: No evidence of acute infarction, hemorrhage, hydrocephalus, extra-axial collection or mass lesion/mass  effect. Well-defined and low-density infarcts seen in the right occipital lobe, left superior cerebellum, and posterior left frontal lobe. Remote appearing lacunar infarcts in the bilateral medial thalamus and putamen. Dystrophic calcification in the right occipital infarct. Vascular: Atherosclerotic calcification. Skull: No acute or aggressive finding Sinuses/Orbits: No acute finding IMPRESSION: 1. No acute finding. 2. 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 Electronically Signed   By: Monte Fantasia M.D.   On: 12/16/2016 13:07    Procedures Procedures (including critical care time)  Medications Ordered in ED Medications  cefTRIAXone (ROCEPHIN) IVPB 1 g (not administered)  sodium chloride 0.9 % bolus 500 mL (500 mLs Intravenous New Bag/Given 12/16/16 1325)     Initial Impression / Assessment and Plan / ED Course  I have reviewed the triage vital signs and the nursing notes.  Pertinent labs & imaging results that were available during my care of the patient were reviewed by me and considered in my medical decision making (see chart for details).     Joel Davis is a 66 y.o. male here with AMS, confusion. Patient had flu swab earlier and had some nose bleeds that stopped. Very confused in the ED. Had chills and subjective fevers at the  facility. Will do sepsis workup with labs, UA, CXR, CT head.   3:06 PM CT head unremarkable. Hg 12.9. Platelet normal. Don't know why he has petechiae. UA + large leuk and nitrates. Na 146, Cr 1.8, likely from dehydration. CXR clear. Given rocephin. Since he is altered, will admit for UTI.      Final Clinical Impressions(s) / ED Diagnoses   Final diagnoses:  None    New Prescriptions New Prescriptions   No medications on file     Drenda Freeze, MD 12/16/16 1507

## 2016-12-16 NOTE — Progress Notes (Signed)
   [place] at [facility] Day: 0 days Joel Davis is a 66 y.o. male presenting with Altered Mental Status  Has vascular dementia, oriented to self at baseline, but follows commands.  Advance care planning discussed with patients sister Ms. Murphy over the phone. All questions in regards to overall condition and expected prognosis answered. She wants to do everything to help her brother in case of cardiac arrest to see if he can be brought back. But if needs on up on a vent without meaningful recovery- fmaily might want to withdraw care at that time. The decision was made to continue current code status  CODE STATUS: Full code Time spent: 18 minutes

## 2016-12-16 NOTE — ED Notes (Addendum)
Admitting MD in room to assess patient at this time.  Patient now speaking some.

## 2016-12-17 LAB — CBC
HEMATOCRIT: 36.9 % — AB (ref 40.0–52.0)
HEMOGLOBIN: 12.4 g/dL — AB (ref 13.0–18.0)
MCH: 32.8 pg (ref 26.0–34.0)
MCHC: 33.6 g/dL (ref 32.0–36.0)
MCV: 97.4 fL (ref 80.0–100.0)
Platelets: 213 10*3/uL (ref 150–440)
RBC: 3.78 MIL/uL — AB (ref 4.40–5.90)
RDW: 15.1 % — ABNORMAL HIGH (ref 11.5–14.5)
WBC: 9.1 10*3/uL (ref 3.8–10.6)

## 2016-12-17 LAB — BASIC METABOLIC PANEL
Anion gap: 8 (ref 5–15)
BUN: 39 mg/dL — ABNORMAL HIGH (ref 6–20)
CO2: 24 mmol/L (ref 22–32)
Calcium: 8.7 mg/dL — ABNORMAL LOW (ref 8.9–10.3)
Chloride: 116 mmol/L — ABNORMAL HIGH (ref 101–111)
Creatinine, Ser: 1.56 mg/dL — ABNORMAL HIGH (ref 0.61–1.24)
GFR calc Af Amer: 51 mL/min — ABNORMAL LOW (ref >60)
GFR calc non Af Amer: 44 mL/min — ABNORMAL LOW (ref >60)
Glucose, Bld: 121 mg/dL — ABNORMAL HIGH (ref 65–99)
Potassium: 4.2 mmol/L (ref 3.5–5.1)
Sodium: 148 mmol/L — ABNORMAL HIGH (ref 135–145)

## 2016-12-17 LAB — MRSA PCR SCREENING: MRSA by PCR: NEGATIVE

## 2016-12-17 MED ORDER — DEXTROSE 5 % IV SOLN
INTRAVENOUS | Status: AC
  Administered 2016-12-17 (×2): via INTRAVENOUS

## 2016-12-17 NOTE — Consult Note (Signed)
Pharmacy Antibiotic Note  Joel Davis is a 66 y.o. male admitted on 12/16/2016 with UTI.  Pharmacy has been consulted for ceftriaxone dosing. Pt presents w/ AMS and fever, unable to test for flu due to epistaxis  Plan: Continue ceftriaxone 1 g IV daily  Height: 5\' 9"  (175.3 cm) Weight: 170 lb (77.1 kg) IBW/kg (Calculated) : 70.7  Temp (24hrs), Avg:98.1 F (36.7 C), Min:97.2 F (36.2 C), Max:98.6 F (37 C)   Recent Labs Lab 12/16/16 1310 12/17/16 0459  WBC 11.4* 9.1  CREATININE 1.85* 1.56*    Estimated Creatinine Clearance: 45.3 mL/min (by C-G formula based on SCr of 1.56 mg/dL (H)).    No Known Allergies  Antimicrobials this admission: ceftriaxone 2/15 >>   Microbiology results:  2/15 UCx:  UA: many bacteria, nitrate +, large leukocytes, 6-30 WBC  Thank you for allowing pharmacy to be a part of this patient's care.  Lenis Noon, Pharm.D, BCPS Clinical Pharmacist 12/17/2016 8:57 AM

## 2016-12-17 NOTE — Progress Notes (Signed)
PT Cancellation Note  Patient Details Name: Joel Davis MRN: [id number] DOB: 07/28/1951   Cancelled Treatment:    Reason Eval/Treat Not Completed: Fatigue/lethargy limiting ability to participate (chart reviewed, pt sleeping upon entering and unable to be aroused w/ sternal rub, will reattempt when more alert)   Orlene Och Student PT 12/17/2016, 10:38 AM

## 2016-12-17 NOTE — Progress Notes (Addendum)
Pt very lethargic and not communicating with me.  He will open his eyes when I call his name, but then closes them again.  Recording nurse did not give his evening meds due to his lack of participation and communcation.

## 2016-12-17 NOTE — NC FL2 (Signed)
[place] LEVEL OF CARE SCREENING TOOL     IDENTIFICATION  Patient Name: Joel Davis Birthdate: 1951/07/02 Sex: male Admission Date (Current Location): 12/16/2016  [place] and [place] Number:  Engineering geologist and Address:  [facility], [address] [place], [place] [address]      Provider Number: [id number]  Attending Physician Name and Address:  Henreitta Leber, MD  Relative Name and Phone Number:       Current Level of Care: Hospital Recommended Level of Care: [place] Prior Approval Number:    Date Approved/Denied:   PASRR Number:  ([id number])  Discharge Plan: SNF    Current Diagnoses: Patient Active Problem List   Diagnosis Date Noted  . Metabolic encephalopathy [id number]    Orientation RESPIRATION BLADDER Height & Weight     Self  Normal Incontinent Weight: 170 lb (77.1 kg) Height:  5\' 9"  (175.3 cm)  BEHAVIORAL SYMPTOMS/MOOD NEUROLOGICAL BOWEL NUTRITION STATUS   (none)  (none) Continent Diet (Diet: Heart Healthy )  AMBULATORY STATUS COMMUNICATION OF NEEDS Skin   Extensive Assist Verbally Normal                       Personal Care Assistance Level of Assistance  Bathing, Feeding, Dressing Bathing Assistance: Limited assistance Feeding assistance: Independent Dressing Assistance: Limited assistance     Functional Limitations Info  Sight, Hearing, Speech Sight Info: Adequate Hearing Info: Adequate Speech Info: Adequate    SPECIAL CARE FACTORS FREQUENCY                       Contractures      Additional Factors Info  Code Status, Allergies Code Status Info:  (Full Code. ) Allergies Info:  (No Known Allergies. )           Current Medications (12/17/2016):  This is the current hospital active medication list Current Facility-Administered Medications  Medication Dose Route Frequency Provider Last Rate Last Dose  . acetaminophen (TYLENOL) tablet 650 mg  650 mg  Oral Q6H PRN Gladstone Lighter, MD       Or  . acetaminophen (TYLENOL) suppository 650 mg  650 mg Rectal Q6H PRN Gladstone Lighter, MD      . aspirin EC tablet 81 mg  81 mg Oral Daily Gladstone Lighter, MD      . atorvastatin (LIPITOR) tablet 80 mg  80 mg Oral QPM Gladstone Lighter, MD      . cefTRIAXone (ROCEPHIN) 1 g in dextrose 5 % 50 mL IVPB  1 g Intravenous Q24H Melissa D Maccia, RPH   1 g at 12/17/16 1313  . citalopram (CELEXA) tablet 20 mg  20 mg Oral Daily Gladstone Lighter, MD      . cyanocobalamin tablet 500 mcg  500 mcg Oral Daily Gladstone Lighter, MD      . dextrose 5 % solution   Intravenous Continuous Henreitta Leber, MD 75 mL/hr at 12/17/16 0854    . enoxaparin (LOVENOX) injection 40 mg  40 mg Subcutaneous Q24H Gladstone Lighter, MD   40 mg at 12/16/16 2141  . metoprolol (LOPRESSOR) tablet 50 mg  50 mg Oral q morning - 10a Gladstone Lighter, MD      . metoprolol tartrate (LOPRESSOR) tablet 25 mg  25 mg Oral QHS Gladstone Lighter, MD      . ondansetron (ZOFRAN) tablet 4 mg  4 mg Oral Q6H PRN Gladstone Lighter, MD  Or  . ondansetron (ZOFRAN) injection 4 mg  4 mg Intravenous Q6H PRN Gladstone Lighter, MD      . oxyCODONE (Oxy IR/ROXICODONE) immediate release tablet 5 mg  5 mg Oral Q4H PRN Gladstone Lighter, MD      . polyethylene glycol (MIRALAX / GLYCOLAX) packet 17 g  17 g Oral Daily PRN Gladstone Lighter, MD      . senna-docusate (Senokot-S) tablet 1 tablet  1 tablet Oral Daily Gladstone Lighter, MD         Discharge Medications: Please see discharge summary for a list of discharge medications.  Relevant Imaging Results:  Relevant Lab Results:   Additional Information  (SSN: [id number])  Taedyn Glasscock, Veronia Beets, LCSW

## 2016-12-17 NOTE — Progress Notes (Signed)
[place] at [place] NAME: Mancil Noia    MR#:  [id number]  DATE OF BIRTH:  [birth year]  SUBJECTIVE:   Pt. Here due to AMS due to UTI. Remains altered and non-verbal.  No other acute events overnight.   REVIEW OF SYSTEMS:    Review of Systems  Unable to perform ROS: Mental acuity    Nutrition: Heart Healthy Tolerating Diet: No Tolerating PT:  Await Eval.     DRUG ALLERGIES:  No Known Allergies  VITALS:  Blood pressure 124/66, pulse 77, temperature 98.7 F (37.1 C), resp. rate 18, height 5\' 9"  (1.753 m), weight 77.1 kg (170 lb), SpO2 95 %.  PHYSICAL EXAMINATION:   Physical Exam  GENERAL:  66 y.o.-year-old patient lying in the bed lethargic/enceophalopathic EYES: Pupils equal, round, reactive to light. No scleral icterus. Extraocular muscles intact.  HEENT: Head atraumatic, normocephalic. Oropharynx and nasopharynx clear.  Dry Oral Mucosa NECK:  Supple, no jugular venous distention. No thyroid enlargement, no tenderness.  LUNGS: Normal breath sounds bilaterally, no wheezing, rales, rhonchi. No use of accessory muscles of respiration.  CARDIOVASCULAR: S1, S2 normal. No murmurs, rubs, or gallops.  ABDOMEN: Soft, nontender, nondistended. Bowel sounds present. No organomegaly or mass.  EXTREMITIES: No cyanosis, clubbing or edema b/l.    NEUROLOGIC: Cranial nerves II through XII are intact. No focal Motor or sensory deficits b/l.  Globally weak but follows simple commands.  PSYCHIATRIC: The patient is alert and oriented x 1.  SKIN: No obvious rash, lesion, or ulcer.    LABORATORY PANEL:   CBC  Recent Labs Lab 12/17/16 0459  WBC 9.1  HGB 12.4*  HCT 36.9*  PLT 213   ------------------------------------------------------------------------------------------------------------------  Chemistries   Recent Labs Lab 12/16/16 1310 12/17/16 0459  NA 146* 148*  K 4.7 4.2  CL 112* 116*  CO2 26 24  GLUCOSE 125* 121*  BUN  55* 39*  CREATININE 1.85* 1.56*  CALCIUM 9.0 8.7*  AST 18  --   ALT 11*  --   ALKPHOS 70  --   BILITOT 0.5  --    ------------------------------------------------------------------------------------------------------------------  Cardiac Enzymes No results for input(s): TROPONINI in the last 168 hours. ------------------------------------------------------------------------------------------------------------------  RADIOLOGY:  Dg Chest 1 View  Result Date: 12/16/2016 CLINICAL DATA:  Epistaxis following swab in the nasal passages for the flu test. Increasing weakness and altered mental status over the past several days. History of coronary artery disease, atrial fibrillation-flutter. EXAM: CHEST 1 VIEW COMPARISON:  None in PACs FINDINGS: The lungs are hypoinflated. The interstitial markings are mildly prominent. There is no alveolar infiltrate or pleural effusion. The cardiac silhouette is enlarged. The pulmonary vascularity is normal. There is calcification in the wall of the aortic arch. There is subtle contour irregularity of the lateral aspect of the left fifth rib which may reflect a fracture. No bony abnormality is observed elsewhere. IMPRESSION: Mild cardiomegaly.  No pulmonary edema.  No definite pneumonia. Thoracic aortic atherosclerosis. Possible acute or subacute fracture of the lateral aspect of the left fourth rib. Electronically Signed   By: David  Martinique M.D.   On: 12/16/2016 13:17   Ct Head Wo Contrast  Result Date: 12/16/2016 CLINICAL DATA:  Altered mental status EXAM: CT HEAD WITHOUT CONTRAST TECHNIQUE: Contiguous axial images were obtained from the base of the skull through the vertex without intravenous contrast. COMPARISON:  None. FINDINGS: Brain: No evidence of acute infarction, hemorrhage, hydrocephalus, extra-axial collection or mass lesion/mass effect. Well-defined and low-density infarcts seen in  the right occipital lobe, left superior cerebellum, and posterior left  frontal lobe. Remote appearing lacunar infarcts in the bilateral medial thalamus and putamen. Dystrophic calcification in the right occipital infarct. Vascular: Atherosclerotic calcification. Skull: No acute or aggressive finding Sinuses/Orbits: No acute finding IMPRESSION: 1. No acute finding. 2. 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 Electronically Signed   By: Monte Fantasia M.D.   On: 12/16/2016 13:07     ASSESSMENT AND PLAN:   66 year old male with past medical history of hypertension, diabetes, dementia, history of coronary artery disease, atrial flutter, anxiety who presented to the hospital due to altered mental status and noted to have a urinary tract infection.  1. Altered mental status-metabolic encephalopathy secondary to underlying hypernatremia and urinary tract infection. -Continue IV fluids to treat the hypernatremia.  Cont. IV Ceftriaxone to treat UTI.  - CT head (-).  Follow mental status  2. Hypernatremia - due to dehydration and poor PO intake.  - cont. D5W and follow sodium  3. UTI - cont. IV Ceftriaxone, follow urine cultures.   4.  AKI - due to UTI/dehydration.  - cont. IV fluids and follow BUN/Cr which is improving.   5. Depression - cont. Celexa.   6. HTN - cont. Toprol  7. Hyperlipidemia - cont. Atorvastatin   All the records are reviewed and case discussed with Care Management/Social Worker. Management plans discussed with the patient, family and they are in agreement.  CODE STATUS: Full Code  DVT Prophylaxis: Lovenox  TOTAL TIME TAKING CARE OF THIS PATIENT: 30 minutes.   POSSIBLE D/C IN 1-2 DAYS, DEPENDING ON CLINICAL CONDITION.   Henreitta Leber M.D on 12/17/2016 at 2:08 PM  Between 7am to 6pm - Pager - [phone]  After 6pm go to [email or web address] - Proofreader  [facility]  Office   [phone]  CC: Primary care physician; Alvester Morin, MD

## 2016-12-17 NOTE — Progress Notes (Signed)
PT Cancellation Note  Patient Details Name: Joel Davis MRN: [id number] DOB: 1950/12/23   Cancelled Treatment:    Reason Eval/Treat Not Completed: Fatigue/lethargy limiting ability to participate (consulted nursing to see if patient able to participate in PT, nursing stated "today is not a good day", patient unable to be aroused, will re-attempt tomorrow)   Orlene Och Student PT 12/17/2016, 2:20 PM

## 2016-12-17 NOTE — Clinical Social Work Note (Signed)
Clinical Social Work Assessment  Patient Details  Name: Joel Davis MRN: [id number] Date of Birth: 1951-05-01  Date of referral:  12/17/16               Reason for consult:  Other (Comment Required) (From [facility] SN[facility] LTC )                Permission sought to share information with:  Chartered certified accountant granted to share information::  Yes, Verbal Permission Granted  Name::      [employer]::      Relationship::     Contact Information:     Housing/Transportation Living arrangements for the past 2 months:  [place] of Information:  Facility, Other (Comment Required) (Sister Systems developer ) Patient Interpreter Needed:  None Criminal Activity/Legal Involvement Pertinent to Current Situation/Hospitalization:  No - Comment as needed Significant Relationships:  Siblings Lives with:  Facility Resident Do you feel safe going back to the place where you live?    Need for family participation in patient care:  Yes (Comment)  Care giving concerns: Patient is a long term care resident at [facility].    Social Worker assessment / plan:  Holiday representative ([place]) received consult that patient is from [facility]. CSW contacted [facility] and spoke to [place] in the business office who is covering for admissions today. Per Tiffany patient is a long term care resident and has been at [facility] since 2013. Per Tiffany patient can return when stable. CSW attempted to meet with patient however he could not answer questions and did not participate in assessment. CSW contacted patient's sister Joel Davis who is his POA. Per Joel Davis she lives in [place], [place] and is patient's primary contact. Per Joel Davis patient has been a long term care resident at [facility] since he had a stroke and could not care for himself. Sister is agreeable for patient to return to [facility] when stable. FL2 complete. CSW will continue to follow and  assist as needed.   Employment status:  Retired, Disabled (Comment on whether or not currently receiving Disability) Insurance information:  Medicare PT Recommendations:  [place] / Referral to community resources:  [place]  Patient/Family's Response to care:  Patient's sister is agreeable for patient to return to [facility].   Patient/Family's Understanding of and Emotional Response to Diagnosis, Current Treatment, and Prognosis:  Patient's sister was very pleasant and thanked CSW for calling.   Emotional Assessment Appearance:  Appears stated age Attitude/Demeanor/Rapport:  Unable to Assess Affect (typically observed):  Unable to Assess Orientation:  Oriented to Self, Fluctuating Orientation (Suspected and/or reported Sundowners) Alcohol / Substance use:  Not Applicable Psych involvement (Current and /or in the community):  No (Comment)  Discharge Needs  Concerns to be addressed:  Discharge Planning Concerns Readmission within the last 30 days:  No Current discharge risk:  Dependent with Mobility Barriers to Discharge:  Continued Medical Work up   [employer], Veronia Beets, LCSW 12/17/2016, 4:29 PM

## 2016-12-18 LAB — URINE CULTURE

## 2016-12-18 MED ORDER — DEXTROSE 5 % IV SOLN
INTRAVENOUS | Status: DC
  Administered 2016-12-18: 14:00:00 75 mL via INTRAVENOUS
  Administered 2016-12-19: 04:00:00 via INTRAVENOUS

## 2016-12-18 MED ORDER — DEXTROSE 5 % IV SOLN: INTRAVENOUS | Status: DC

## 2016-12-18 NOTE — Evaluation (Signed)
Physical Therapy Evaluation Patient Details Name: Joel Davis MRN: [id number] DOB: [birth year] Today's Date: 12/18/2016   History of Present Illness  presented to ER secondary to AMS, fever; admitted with UTI, cystitis.  Clinical Impression  Upon evaluation, patient generally non-verbal, except for some yes/no questions.  Follows simple commands approx 25-40% time, optimized with gestures and hand-over-hand.  Significant L hemiparesis evident throughout both UE/LE (with moderate amounts of extensor tone noted), limtied active use of L hemi-body with functional activities. Currently requiring mod/max assist for rolling; max/dep assist for sit/supine and max/total assist for unsupported sitting balance.  Does attempt to assist with balance using R UE, but unable to maintain without total/dep assist from therapist (increased sway in all planes). Unsafe/unable to attempt OOB at this time. Family/facility unavailable to verify baseline level of function at time of evaluation.  Will continue efforts to determine baseline (chart indicates patient LTC at facility).  If current status is determined baseline, will discontinue PT services; however, if patient known to have change in functional status due to acute illness, Would benefit from skilled PT to address above deficits and promote optimal return to PLOF.  Recommend transition to SNF vs LTC upon discharge.     Follow Up Recommendations SNF (pending clarification of baseline status; if current status becomes evident as baseline, will discontinue PT services)    Equipment Recommendations       Recommendations for Other Services       Precautions / Restrictions Precautions Precautions: Fall Restrictions Weight Bearing Restrictions: No      Mobility  Bed Mobility Overal bed mobility: Needs Assistance Bed Mobility: Rolling;Supine to Sit;Sit to Supine Rolling: Mod assist;Max assist   Supine to sit: Max assist;Total assist Sit to supine:  Max assist;Total assist   General bed mobility comments: patient with minimal task initiation apart from therapist facilitation; automatically follows task once positioned by therapist  Transfers                 General transfer comment: unsafe to attempt OOB due to poor sitting balance  Ambulation/Gait             General Gait Details: unsafe to attempt OOB due to poor sitting balance  Stairs            Wheelchair Mobility    Modified Rankin (Stroke Patients Only)       Balance Overall balance assessment: Needs assistance Sitting-balance support: No upper extremity supported;Feet supported Sitting balance-Leahy Scale: Zero Sitting balance - Comments: sway/LOB in all planes with absent balance/righting reactions (does attempt to hold bedframe with R UE to assist), max/dep required to maintain upright)       Standing balance comment: unsafe/unable to attempt                             Pertinent Vitals/Pain Pain Assessment: Faces Pain Score: 0-No pain    Home Living Family/patient expects to be discharged to:: Skilled nursing facility                 Additional Comments: 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    Prior Function Level of Independence: Needs assistance         Comments: Patient unable to verbalize; will verify with family/facility as available     Hand Dominance   Dominant Hand: Right    Extremity/Trunk Assessment   Upper Extremity Assessment Upper Extremity Assessment:  (L UE with significant  shoulder ROM limitations (approx 10 degrees all planes), significant extensor tone with exertion)    Lower Extremity Assessment Lower Extremity Assessment:  (L LE with global 2/4 tone throughout extremity, L ankle DF lacking approx 5-8 degrees from neutral)       Communication   Communication:  (generally non-verbal except for yes/no questions (answers in "uh-huh" and "huh-uh"))  Cognition  Arousal/Alertness: Awake/alert Behavior During Therapy: Flat affect Overall Cognitive Status: Difficult to assess                 General Comments: Appears to follow some degrees of simple commands, optimized with gestures from therapist; correctly locates self to hospital with yes/no questions    General Comments      Exercises     Assessment/Plan    PT Assessment Patient needs continued PT services (pending clarification of baseline status)  PT Problem List Decreased range of motion;Decreased cognition;Decreased balance;Decreased activity tolerance;Decreased strength;Decreased mobility;Decreased coordination;Decreased safety awareness;Decreased knowledge of precautions;Decreased knowledge of use of DME          PT Treatment Interventions DME instruction;Functional mobility training;Therapeutic activities;Therapeutic exercise;Balance training;Patient/family education;Neuromuscular re-education    PT Goals (Current goals can be found in the Care Plan section)  Acute Rehab PT Goals PT Goal Formulation: Patient unable to participate in goal setting Time For Goal Achievement: 01/01/17 Potential to Achieve Goals: Fair    Frequency Min 2X/week   Barriers to discharge        Co-evaluation               End of Session   Activity Tolerance: Patient tolerated treatment well Patient left: in bed;with call bell/phone within reach;with bed alarm set           Time: 0812-0828 PT Time Calculation (min) (ACUTE ONLY): 16 min   Charges:   PT Evaluation $PT Eval Low Complexity: 1 Procedure     PT G Codes:        Johnluke Haugen H. Owens Shark, PT, DPT, NCS 12/18/16, 8:43 AM [phone]

## 2016-12-18 NOTE — Progress Notes (Signed)
[place] [facility] [place] NAME: Joel Davis    MR#:  [id number]  DATE OF BIRTH:  [birth year]  SUBJECTIVE:   Pt. Here due to AMS due to UTI.  A bit more awake today and follows simple commands but still confused.  Not sure of pt's baseline.   REVIEW OF SYSTEMS:    Review of Systems  Unable to perform ROS: Mental acuity    Nutrition: Heart Healthy Tolerating Diet: Little. Tolerating PT:  Eval noted.    DRUG ALLERGIES:  No Known Allergies  VITALS:  Blood pressure (!) 143/50, pulse 73, temperature 98 F (36.7 C), temperature source Oral, resp. rate 18, height 5\' 9"  (1.753 m), weight 77.1 kg (170 lb), SpO2 100 %.  PHYSICAL EXAMINATION:   Physical Exam  GENERAL:  66 y.o.-year-old patient lying in the bed lethargic/enceophalopathic EYES: Pupils equal, round, reactive to light. No scleral icterus. Extraocular muscles intact.  HEENT: Head atraumatic, normocephalic. Oropharynx and nasopharynx clear.  Dry Oral Mucosa NECK:  Supple, no jugular venous distention. No thyroid enlargement, no tenderness.  LUNGS: Normal breath sounds bilaterally, no wheezing, rales, rhonchi. No use of accessory muscles of respiration.  CARDIOVASCULAR: S1, S2 normal. No murmurs, rubs, or gallops.  ABDOMEN: Soft, nontender, nondistended. Bowel sounds present. No organomegaly or mass.  EXTREMITIES: No cyanosis, clubbing or edema b/l.    NEUROLOGIC: Cranial nerves II through XII are intact. No focal Motor or sensory deficits b/l.  Globally weak but follows simple commands.  PSYCHIATRIC: The patient is alert and oriented x 1.  SKIN: No obvious rash, lesion, or ulcer.    LABORATORY PANEL:   CBC  Recent Labs Lab 12/17/16 0459  WBC 9.1  HGB 12.4*  HCT 36.9*  PLT 213   ------------------------------------------------------------------------------------------------------------------  Chemistries   Recent Labs Lab 12/16/16 1310 12/17/16 0459  NA 146* 148*   K 4.7 4.2  CL 112* 116*  CO2 26 24  GLUCOSE 125* 121*  BUN 55* 39*  CREATININE 1.85* 1.56*  CALCIUM 9.0 8.7*  AST 18  --   ALT 11*  --   ALKPHOS 70  --   BILITOT 0.5  --    ------------------------------------------------------------------------------------------------------------------  Cardiac Enzymes No results for input(s): TROPONINI in the last 168 hours. ------------------------------------------------------------------------------------------------------------------  RADIOLOGY:  No results found.   ASSESSMENT AND PLAN:   66 year old male with past medical history of hypertension, diabetes, dementia, history of coronary artery disease, atrial flutter, anxiety who presented to the hospital due to altered mental status and noted to have a urinary tract infection.  1. Altered mental status-metabolic encephalopathy secondary to underlying hypernatremia and urinary tract infection. -Continue IV fluids to treat the hypernatremia.  Cont. IV Ceftriaxone to treat UTI.  - CT head (-).  Follow mental status which is slowly improving.   2. Hypernatremia - due to dehydration and poor PO intake.  - cont. D5W. Follow sodium.   3. UTI - cont. IV Ceftriaxone - urine cultures + for E. Coli and it's sensitive to Ceftriaxone.   4.  AKI - due to UTI/dehydration.  - cont. IV fluids and follow BUN/Cr which is improving.   5. Depression - cont. Celexa.   6. HTN - cont. Toprol  7. Hyperlipidemia - cont. Atorvastatin  Seen by PT and they recommend SNF and pt. Comes from a SNF.   All the records are reviewed and case discussed with Care Management/Social Worker. Management plans discussed with the patient, family and they are in agreement.  CODE STATUS: Full Code  DVT Prophylaxis: Lovenox  TOTAL TIME TAKING CARE OF THIS PATIENT: 30 minutes.   POSSIBLE D/C IN 1-2 DAYS, DEPENDING ON CLINICAL CONDITION.   Henreitta Leber M.D on 12/18/2016 at 2:02 PM  Between 7am to 6pm - Pager -  [phone]  After 6pm go to [email or web address] - Proofreader  [facility]  Office  [phone]  CC: Primary care physician; Alvester Morin, MD

## 2016-12-19 LAB — BASIC METABOLIC PANEL
Anion gap: 8 (ref 5–15)
BUN: 19 mg/dL (ref 6–20)
CALCIUM: 8.6 mg/dL — AB (ref 8.9–10.3)
CO2: 25 mmol/L (ref 22–32)
Chloride: 106 mmol/L (ref 101–111)
Creatinine, Ser: 1.26 mg/dL — ABNORMAL HIGH (ref 0.61–1.24)
GFR calc non Af Amer: 57 mL/min — ABNORMAL LOW (ref >60)
Glucose, Bld: 130 mg/dL — ABNORMAL HIGH (ref 65–99)
Potassium: 3.6 mmol/L (ref 3.5–5.1)
Sodium: 139 mmol/L (ref 135–145)

## 2016-12-19 MED ORDER — OXYCODONE HCL 5 MG PO TABS: 5.0000 mg | ORAL_TABLET | ORAL | 0 refills | Status: DC | PRN

## 2016-12-19 MED ORDER — CEPHALEXIN 500 MG PO CAPS: 500.0000 mg | ORAL_CAPSULE | Freq: Three times a day (TID) | ORAL | 0 refills | Status: AC

## 2016-12-19 NOTE — Clinical Social Work Note (Signed)
Patient to dc to [facility] via non-emergent EMS. Facility and the family are aware. Paperwork sent to facility and packet delivered to unit. CSW will con't to follow pending additional dc needs.  Joel Davis, MSW, Latanya Presser [phone]

## 2016-12-19 NOTE — Discharge Summary (Signed)
[place] at [place] NAME: Joel Davis    MR#:  [id number]  DATE OF BIRTH:  01-Mar-1951  DATE OF ADMISSION:  12/16/2016 ADMITTING PHYSICIAN: Gladstone Lighter, MD  DATE OF DISCHARGE: 12/19/2016  PRIMARY CARE PHYSICIAN: Alvester Morin, MD    ADMISSION DIAGNOSIS:  Infective urethritis [N34.2] Altered mental status, unspecified altered mental status type [R41.82]  DISCHARGE DIAGNOSIS:  Active Problems:   Metabolic encephalopathy   SECONDARY DIAGNOSIS:   Past Medical History:  Diagnosis Date  . Anxiety   . Atrial flutter ([place])   . Basal cell carcinoma   . Cognitive communication deficit   . Coronary artery disease   . Dementia   . DM (diabetes mellitus) ([place])   . Hypertension   . Insomnia     HOSPITAL COURSE:   66 year old male with past medical history of hypertension, diabetes, dementia, history of coronary artery disease, atrial flutter, anxiety who presented to the hospital due to altered mental status and noted to have a urinary tract infection.  1. Altered mental status-metabolic encephalopathy secondary to underlying hypernatremia and urinary tract infection. - pt. Was treated with IV Ceftriaxone for UTI and given D5W to correct the Hypernatremia.  - Sodium has normalized and he has improved and mental status is back to baseline.  Will d/c back to SNF today.   2. Hypernatremia - due to dehydration and poor PO intake.  - improved and corrected and resolved w/ D5W.  Taking PO now.   3. UTI - was on IV Ceftriaxone while in the hospital and urine cultures + for E.coli which is pan sensitive.  - d/c on Oral keflex for a few more days.   4.  AKI - due to UTI/dehydration.  - this has improved w/ IV fluid hydration and his Cr. Is back to baseline.    5. Depression - he will cont. Celexa.   6. HTN - he will cont. Toprol, Lisinopril upon discharge.   7. Hyperlipidemia - he will cont. Atorvastatin  DISCHARGE  CONDITIONS:   Stable  CONSULTS OBTAINED:    DRUG ALLERGIES:  No Known Allergies  DISCHARGE MEDICATIONS:   Allergies as of 12/19/2016   No Known Allergies     Medication List    STOP taking these medications   hydrochlorothiazide 12.5 MG tablet Commonly known as:  HYDRODIURIL     TAKE these medications   aspirin EC 81 MG tablet Take 81 mg by mouth daily.   atorvastatin 80 MG tablet Commonly known as:  LIPITOR Take 80 mg by mouth daily.   cephALEXin 500 MG capsule Commonly known as:  KEFLEX Take 1 capsule (500 mg total) by mouth 3 (three) times daily.   chlorhexidine 0.12 % solution Commonly known as:  PERIDEX Use as directed 15 mLs in the mouth or throat daily.   citalopram 20 MG tablet Commonly known as:  CELEXA Take 20 mg by mouth daily.   lisinopril 40 MG tablet Commonly known as:  PRINIVIL,ZESTRIL Take 40 mg by mouth daily.   metoprolol tartrate 25 MG tablet Commonly known as:  LOPRESSOR Take 25 mg by mouth daily. Take 25 mg every evening.   metoprolol 50 MG tablet Commonly known as:  LOPRESSOR Take 50 mg by mouth daily. Take 50 mg every morning.   oxyCODONE 5 MG immediate release tablet Commonly known as:  Oxy IR/ROXICODONE Take 1 tablet (5 mg total) by mouth every 4 (four) hours as needed for severe pain.   sennosides-docusate sodium  8.6-50 MG tablet Commonly known as:  SENOKOT-S Take 1 tablet by mouth daily.   vitamin B-12 500 MCG tablet Commonly known as:  CYANOCOBALAMIN Take 500 mcg by mouth daily.         DISCHARGE INSTRUCTIONS:   DIET:  Cardiac diet  DISCHARGE CONDITION:  Stable  ACTIVITY:  Activity as tolerated  OXYGEN:  Home Oxygen: No.   Oxygen Delivery: room air  DISCHARGE LOCATION:  nursing home   If you experience worsening of your admission symptoms, develop shortness of breath, life threatening emergency, suicidal or homicidal thoughts you must seek medical attention immediately by calling 911 or calling your  MD immediately  if symptoms less severe.  You Must read complete instructions/literature along with all the possible adverse reactions/side effects for all the Medicines you take and that have been prescribed to you. Take any new Medicines after you have completely understood and accpet all the possible adverse reactions/side effects.   Please note  You were cared for by a hospitalist during your hospital stay. If you have any questions about your discharge medications or the care you received while you were in the hospital after you are discharged, you can call the unit and asked to speak with the hospitalist on call if the hospitalist that took care of you is not available. Once you are discharged, your primary care physician will handle any further medical issues. Please note that NO REFILLS for any discharge medications will be authorized once you are discharged, as it is imperative that you return to your primary care physician (or establish a relationship with a primary care physician if you do not have one) for your aftercare needs so that they can reassess your need for medications and monitor your lab values.     Today   More awake and mental status back to baseline. Tolerating PO.  Sodium level normalized.   VITAL SIGNS:  Blood pressure 121/79, pulse 72, temperature 97.9 F (36.6 C), temperature source Oral, resp. rate 18, height 5\' 9"  (1.753 m), weight 77.1 kg (170 lb), SpO2 100 %.  I/O:   Intake/Output Summary (Last 24 hours) at 12/19/16 1025 Last data filed at 12/19/16 0309  Gross per 24 hour  Intake          1056.25 ml  Output                0 ml  Net          1056.25 ml    PHYSICAL EXAMINATION:   GENERAL:  66 y.o.-year-old patient lying in the bed with no acute distress.  EYES: Pupils equal, round, reactive to light and accommodation. No scleral icterus. Extraocular muscles intact.  HEENT: Head atraumatic, normocephalic. Oropharynx and nasopharynx clear.  NECK:   Supple, no jugular venous distention. No thyroid enlargement, no tenderness.  LUNGS: Normal breath sounds bilaterally, no wheezing, rales,rhonchi. No use of accessory muscles of respiration.  CARDIOVASCULAR: S1, S2 normal. No murmurs, rubs, or gallops.  ABDOMEN: Soft, non-tender, non-distended. Bowel sounds present. No organomegaly or mass.  EXTREMITIES: No pedal edema, cyanosis, or clubbing.  NEUROLOGIC: Cranial nerves II through XII are intact. No focal motor or sensory defecits b/l.  PSYCHIATRIC: The patient is alert and oriented x 1.  SKIN: No obvious rash, lesion, or ulcer.   DATA REVIEW:   CBC  Recent Labs Lab 12/17/16 0459  WBC 9.1  HGB 12.4*  HCT 36.9*  PLT 213    Chemistries   Recent Labs Lab 12/16/16 1310  12/19/16 0506  NA 146*  < > 139  K 4.7  < > 3.6  CL 112*  < > 106  CO2 26  < > 25  GLUCOSE 125*  < > 130*  BUN 55*  < > 19  CREATININE 1.85*  < > 1.26*  CALCIUM 9.0  < > 8.6*  AST 18  --   --   ALT 11*  --   --   ALKPHOS 70  --   --   BILITOT 0.5  --   --   < > = values in this interval not displayed.  Cardiac Enzymes No results for input(s): TROPONINI in the last 168 hours.  Microbiology Results  Results for orders placed or performed during the hospital encounter of 12/16/16  MRSA PCR Screening     Status: None   Collection Time: 12/16/16  4:00 AM  Result Value Ref Range Status   MRSA by PCR NEGATIVE NEGATIVE Final    Comment:        The GeneXpert MRSA Assay (FDA approved for NASAL specimens only), is one component of a comprehensive MRSA colonization surveillance program. It is not intended to diagnose MRSA infection nor to guide or monitor treatment for MRSA infections.   Urine culture     Status: Abnormal   Collection Time: 12/16/16  1:13 PM  Result Value Ref Range Status   Specimen Description URINE, RANDOM  Final   Special Requests NONE  Final   Culture >=100,000 COLONIES/mL ESCHERICHIA COLI (A)  Final   Report Status 12/18/2016  FINAL  Final   Organism ID, Bacteria ESCHERICHIA COLI (A)  Final      Susceptibility   Escherichia coli - MIC*    AMPICILLIN >=32 RESISTANT Resistant     CEFAZOLIN 8 SENSITIVE Sensitive     CEFTRIAXONE <=1 SENSITIVE Sensitive     CIPROFLOXACIN >=4 RESISTANT Resistant     GENTAMICIN <=1 SENSITIVE Sensitive     IMIPENEM <=0.25 SENSITIVE Sensitive     NITROFURANTOIN <=16 SENSITIVE Sensitive     TRIMETH/SULFA <=20 SENSITIVE Sensitive     AMPICILLIN/SULBACTAM >=32 RESISTANT Resistant     PIP/TAZO <=4 SENSITIVE Sensitive     Extended ESBL NEGATIVE Sensitive     * >=100,000 COLONIES/mL ESCHERICHIA COLI    RADIOLOGY:  No results found.    Management plans discussed with the patient, family and they are in agreement.  CODE STATUS:     Code Status Orders        Start     Ordered   12/16/16 1809  Full code  Continuous     12/16/16 1808    Code Status History    Date Active Date Inactive Code Status Order ID Comments User Context   This patient has a current code status but no historical code status.      TOTAL TIME TAKING CARE OF THIS PATIENT: 40 minutes.    Henreitta Leber M.D on 12/19/2016 at 10:25 AM  Between 7am to 6pm - Pager - [phone]  After 6pm go to [email or web address] - Proofreader  [facility]  Office  [phone]  CC: Primary care physician; Alvester Morin, MD

## 2017-03-25 ENCOUNTER — Emergency Department: Payer: Medicare Other

## 2017-03-25 ENCOUNTER — Inpatient Hospital Stay: Payer: Medicare Other

## 2017-03-25 ENCOUNTER — Inpatient Hospital Stay
Admission: EM | Admit: 2017-03-25 | Discharge: 2017-03-30 | DRG: 689 | Disposition: A | Discharge status: Skilled Nursing Facility | Payer: Medicare Other | Attending: Internal Medicine | Admitting: Internal Medicine

## 2017-03-25 DIAGNOSIS — N183 Chronic kidney disease, stage 3 (moderate): Secondary | ICD-10-CM | POA: Diagnosis present

## 2017-03-25 DIAGNOSIS — Z66 Do not resuscitate: Secondary | ICD-10-CM | POA: Diagnosis present

## 2017-03-25 DIAGNOSIS — F419 Anxiety disorder, unspecified: Secondary | ICD-10-CM | POA: Diagnosis present

## 2017-03-25 DIAGNOSIS — G9340 Encephalopathy, unspecified: Secondary | ICD-10-CM | POA: Diagnosis present

## 2017-03-25 DIAGNOSIS — Z79899 Other long term (current) drug therapy: Secondary | ICD-10-CM | POA: Diagnosis not present

## 2017-03-25 DIAGNOSIS — Z87891 Personal history of nicotine dependence: Secondary | ICD-10-CM

## 2017-03-25 DIAGNOSIS — S0083XA Contusion of other part of head, initial encounter: Secondary | ICD-10-CM | POA: Diagnosis present

## 2017-03-25 DIAGNOSIS — I4892 Unspecified atrial flutter: Secondary | ICD-10-CM | POA: Diagnosis present

## 2017-03-25 DIAGNOSIS — R627 Adult failure to thrive: Secondary | ICD-10-CM | POA: Diagnosis present

## 2017-03-25 DIAGNOSIS — R401 Stupor: Secondary | ICD-10-CM | POA: Diagnosis present

## 2017-03-25 DIAGNOSIS — E785 Hyperlipidemia, unspecified: Secondary | ICD-10-CM | POA: Diagnosis present

## 2017-03-25 DIAGNOSIS — Z85828 Personal history of other malignant neoplasm of skin: Secondary | ICD-10-CM

## 2017-03-25 DIAGNOSIS — W19XXXA Unspecified fall, initial encounter: Secondary | ICD-10-CM | POA: Diagnosis present

## 2017-03-25 DIAGNOSIS — E1122 Type 2 diabetes mellitus with diabetic chronic kidney disease: Secondary | ICD-10-CM | POA: Diagnosis present

## 2017-03-25 DIAGNOSIS — Z880 Allergy status to penicillin: Secondary | ICD-10-CM | POA: Diagnosis not present

## 2017-03-25 DIAGNOSIS — I129 Hypertensive chronic kidney disease with stage 1 through stage 4 chronic kidney disease, or unspecified chronic kidney disease: Secondary | ICD-10-CM | POA: Diagnosis present

## 2017-03-25 DIAGNOSIS — Z7982 Long term (current) use of aspirin: Secondary | ICD-10-CM | POA: Diagnosis not present

## 2017-03-25 DIAGNOSIS — R1310 Dysphagia, unspecified: Secondary | ICD-10-CM | POA: Diagnosis present

## 2017-03-25 DIAGNOSIS — M25529 Pain in unspecified elbow: Secondary | ICD-10-CM | POA: Diagnosis present

## 2017-03-25 DIAGNOSIS — R001 Bradycardia, unspecified: Secondary | ICD-10-CM | POA: Diagnosis not present

## 2017-03-25 DIAGNOSIS — N390 Urinary tract infection, site not specified: Secondary | ICD-10-CM | POA: Diagnosis present

## 2017-03-25 DIAGNOSIS — Z515 Encounter for palliative care: Secondary | ICD-10-CM | POA: Diagnosis not present

## 2017-03-25 DIAGNOSIS — Z7189 Other specified counseling: Secondary | ICD-10-CM

## 2017-03-25 DIAGNOSIS — T148XXA Other injury of unspecified body region, initial encounter: Secondary | ICD-10-CM

## 2017-03-25 DIAGNOSIS — B9620 Unspecified Escherichia coli [E. coli] as the cause of diseases classified elsewhere: Secondary | ICD-10-CM | POA: Diagnosis present

## 2017-03-25 DIAGNOSIS — I2510 Atherosclerotic heart disease of native coronary artery without angina pectoris: Secondary | ICD-10-CM | POA: Diagnosis present

## 2017-03-25 DIAGNOSIS — F0390 Unspecified dementia without behavioral disturbance: Secondary | ICD-10-CM

## 2017-03-25 DIAGNOSIS — Z1389 Encounter for screening for other disorder: Secondary | ICD-10-CM

## 2017-03-25 DIAGNOSIS — Z6825 Body mass index (BMI) 25.0-25.9, adult: Secondary | ICD-10-CM

## 2017-03-25 DIAGNOSIS — E440 Moderate protein-calorie malnutrition: Secondary | ICD-10-CM | POA: Diagnosis present

## 2017-03-25 LAB — HEPATIC FUNCTION PANEL
ALBUMIN: 3.5 g/dL (ref 3.5–5.0)
ALK PHOS: 81 U/L (ref 38–126)
ALT: 15 U/L — ABNORMAL LOW (ref 17–63)
AST: 25 U/L (ref 15–41)
Bilirubin, Direct: <0.1 mg/dL — ABNORMAL LOW (ref 0.1–0.5)
TOTAL PROTEIN: 7.2 g/dL (ref 6.5–8.1)
Total Bilirubin: 0.5 mg/dL (ref 0.3–1.2)

## 2017-03-25 LAB — CBC
HCT: 37.3 % — ABNORMAL LOW (ref 40.0–52.0)
Hemoglobin: 12.3 g/dL — ABNORMAL LOW (ref 13.0–18.0)
MCH: 31.1 pg (ref 26.0–34.0)
MCHC: 33 g/dL (ref 32.0–36.0)
MCV: 94.2 fL (ref 80.0–100.0)
PLATELETS: 268 10*3/uL (ref 150–440)
RBC: 3.96 MIL/uL — ABNORMAL LOW (ref 4.40–5.90)
RDW: 15.5 % — AB (ref 11.5–14.5)
WBC: 9.1 10*3/uL (ref 3.8–10.6)

## 2017-03-25 LAB — URINALYSIS, ROUTINE W REFLEX MICROSCOPIC
BILIRUBIN URINE: NEGATIVE
Glucose, UA: NEGATIVE mg/dL
Hgb urine dipstick: NEGATIVE
Ketones, ur: NEGATIVE mg/dL
NITRITE: POSITIVE — AB
PROTEIN: 30 mg/dL — AB
SPECIFIC GRAVITY, URINE: 1.017 (ref 1.005–1.030)
pH: 5 (ref 5.0–8.0)

## 2017-03-25 LAB — MAGNESIUM: MAGNESIUM: 1.9 mg/dL (ref 1.7–2.4)

## 2017-03-25 LAB — BASIC METABOLIC PANEL
Anion gap: 8 (ref 5–15)
BUN: 23 mg/dL — ABNORMAL HIGH (ref 6–20)
CALCIUM: 9.1 mg/dL (ref 8.9–10.3)
CO2: 26 mmol/L (ref 22–32)
Chloride: 105 mmol/L (ref 101–111)
Creatinine, Ser: 1.37 mg/dL — ABNORMAL HIGH (ref 0.61–1.24)
GFR calc non Af Amer: 51 mL/min — ABNORMAL LOW (ref >60)
GFR, EST AFRICAN AMERICAN: 60 mL/min — AB (ref >60)
Glucose, Bld: 139 mg/dL — ABNORMAL HIGH (ref 65–99)
Potassium: 4.1 mmol/L (ref 3.5–5.1)
SODIUM: 139 mmol/L (ref 135–145)

## 2017-03-25 LAB — APTT: APTT: 30 s (ref 24–36)

## 2017-03-25 LAB — PROTIME-INR
INR: 1.05
Prothrombin Time: 13.7 seconds (ref 11.4–15.2)

## 2017-03-25 LAB — MRSA PCR SCREENING: MRSA by PCR: NEGATIVE

## 2017-03-25 LAB — AMMONIA

## 2017-03-25 LAB — GLUCOSE, CAPILLARY
GLUCOSE-CAPILLARY: 102 mg/dL — AB (ref 65–99)
Glucose-Capillary: 107 mg/dL — ABNORMAL HIGH (ref 65–99)

## 2017-03-25 MED ORDER — SENNOSIDES-DOCUSATE SODIUM 8.6-50 MG PO TABS
1.0000 | ORAL_TABLET | Freq: Every day | ORAL | Status: DC
  Administered 2017-03-27 – 2017-03-30 (×4): 1 via ORAL
  Filled 2017-03-25 (×5): qty 1

## 2017-03-25 MED ORDER — ONDANSETRON HCL 4 MG/2ML IJ SOLN: 4.0000 mg | Freq: Four times a day (QID) | INTRAMUSCULAR | Status: DC | PRN

## 2017-03-25 MED ORDER — CITALOPRAM HYDROBROMIDE 20 MG PO TABS
20.0000 mg | ORAL_TABLET | Freq: Every day | ORAL | Status: DC
  Administered 2017-03-27 – 2017-03-30 (×4): 20 mg via ORAL
  Filled 2017-03-25 (×5): qty 1

## 2017-03-25 MED ORDER — OXYCODONE HCL 5 MG PO TABS: 5.0000 mg | ORAL_TABLET | ORAL | Status: DC | PRN

## 2017-03-25 MED ORDER — ENOXAPARIN SODIUM 40 MG/0.4ML ~~LOC~~ SOLN
40.0000 mg | SUBCUTANEOUS | Status: DC
  Administered 2017-03-26 – 2017-03-29 (×5): 40 mg via SUBCUTANEOUS
  Filled 2017-03-25 (×5): qty 0.4

## 2017-03-25 MED ORDER — CEFTRIAXONE SODIUM IN DEXTROSE 20 MG/ML IV SOLN: 1.0000 g | INTRAVENOUS | Status: DC

## 2017-03-25 MED ORDER — ONDANSETRON HCL 4 MG PO TABS: 4.0000 mg | ORAL_TABLET | Freq: Four times a day (QID) | ORAL | Status: DC | PRN

## 2017-03-25 MED ORDER — ACETAMINOPHEN 325 MG PO TABS
650.0000 mg | ORAL_TABLET | Freq: Four times a day (QID) | ORAL | Status: DC | PRN
  Filled 2017-03-25: qty 2

## 2017-03-25 MED ORDER — ACETAMINOPHEN 650 MG RE SUPP: 650.0000 mg | Freq: Four times a day (QID) | RECTAL | Status: DC | PRN

## 2017-03-25 MED ORDER — METOPROLOL TARTRATE 25 MG PO TABS
25.0000 mg | ORAL_TABLET | Freq: Every day | ORAL | Status: DC
  Administered 2017-03-26 – 2017-03-27 (×2): 25 mg via ORAL
  Filled 2017-03-25 (×2): qty 1

## 2017-03-25 MED ORDER — INSULIN ASPART 100 UNIT/ML ~~LOC~~ SOLN
0.0000 [IU] | SUBCUTANEOUS | Status: DC
  Administered 2017-03-25 – 2017-03-27 (×4): 1 [IU] via SUBCUTANEOUS
  Administered 2017-03-27: 2 [IU] via SUBCUTANEOUS
  Administered 2017-03-27: 1 [IU] via SUBCUTANEOUS
  Administered 2017-03-28 (×2): 2 [IU] via SUBCUTANEOUS
  Administered 2017-03-28 – 2017-03-29 (×2): 1 [IU] via SUBCUTANEOUS
  Administered 2017-03-29: 2 [IU] via SUBCUTANEOUS
  Administered 2017-03-30: 1 [IU] via SUBCUTANEOUS
  Filled 2017-03-25: qty 1
  Filled 2017-03-25 (×2): qty 2
  Filled 2017-03-25 (×2): qty 1
  Filled 2017-03-25: qty 2
  Filled 2017-03-25 (×3): qty 1
  Filled 2017-03-25: qty 2
  Filled 2017-03-25: qty 1
  Filled 2017-03-25: qty 9

## 2017-03-25 MED ORDER — ATORVASTATIN CALCIUM 20 MG PO TABS
80.0000 mg | ORAL_TABLET | Freq: Every day | ORAL | Status: DC
  Administered 2017-03-27 – 2017-03-30 (×4): 80 mg via ORAL
  Filled 2017-03-25 (×5): qty 4

## 2017-03-25 MED ORDER — LISINOPRIL 20 MG PO TABS
40.0000 mg | ORAL_TABLET | Freq: Every day | ORAL | Status: DC
  Administered 2017-03-27 – 2017-03-28 (×2): 40 mg via ORAL
  Filled 2017-03-25 (×4): qty 2

## 2017-03-25 MED ORDER — SODIUM CHLORIDE 0.9 % IV SOLN
INTRAVENOUS | Status: AC
  Administered 2017-03-25: 20:00:00 via INTRAVENOUS

## 2017-03-25 MED ORDER — METOPROLOL TARTRATE 50 MG PO TABS
50.0000 mg | ORAL_TABLET | Freq: Every day | ORAL | Status: DC
  Administered 2017-03-27 – 2017-03-28 (×2): 50 mg via ORAL
  Filled 2017-03-25 (×3): qty 1

## 2017-03-25 MED ORDER — CHLORHEXIDINE GLUCONATE 0.12 % MT SOLN
15.0000 mL | Freq: Every day | OROMUCOSAL | Status: DC
  Administered 2017-03-30: 15 mL via OROMUCOSAL
  Filled 2017-03-25 (×2): qty 15

## 2017-03-25 MED ORDER — CEFTRIAXONE SODIUM-DEXTROSE 1-3.74 GM-% IV SOLR
1.0000 g | INTRAVENOUS | Status: DC
  Administered 2017-03-25: 1 g via INTRAVENOUS
  Filled 2017-03-25 (×3): qty 50

## 2017-03-25 MED ORDER — SODIUM CHLORIDE 0.9 % IV BOLUS (SEPSIS)
500.0000 mL | Freq: Once | INTRAVENOUS | Status: AC
  Administered 2017-03-25: 500 mL via INTRAVENOUS

## 2017-03-25 MED ORDER — POLYETHYLENE GLYCOL 3350 17 G PO PACK
17.0000 g | PACK | Freq: Every day | ORAL | Status: DC | PRN
  Administered 2017-03-29: 17 g via ORAL
  Filled 2017-03-25: qty 1

## 2017-03-25 MED ORDER — ALBUTEROL SULFATE (2.5 MG/3ML) 0.083% IN NEBU: 2.5000 mg | INHALATION_SOLUTION | RESPIRATORY_TRACT | Status: DC | PRN

## 2017-03-25 MED ORDER — ASPIRIN EC 81 MG PO TBEC
81.0000 mg | DELAYED_RELEASE_TABLET | Freq: Every day | ORAL | Status: DC
  Administered 2017-03-27 – 2017-03-30 (×4): 81 mg via ORAL
  Filled 2017-03-25 (×5): qty 1

## 2017-03-25 NOTE — H&P (Signed)
[facility] NAME: Joel Davis    MR#:  [id number]  DATE OF BIRTH:  03-04-1951  DATE OF ADMISSION:  03/25/2017  PRIMARY CARE PHYSICIAN: Alvester Morin, MD   REQUESTING/REFERRING PHYSICIAN: Dr. Jacqualine Code  CHIEF COMPLAINT:   Chief Complaint  Patient presents with  . Fall    HISTORY OF PRESENT ILLNESS:  Joel Davis  is a 66 y.o. male with a known history of Hypertension, diabetes, dementia syndrome from local nursing home due to worsening mental status. At baseline patient follows commands and is able to utter one or 2 words. But he has been found to be increasingly lethargic. Initially when he arrived in the emergency room he was completely nonverbal. Some improvement by now. He does have a UTI with severe acute encephalopathy over his dementia and is being admitted to the hospitalist service. History obtained from ED staff and old records. Patient unable to contribute to history due to dementia.  PAST MEDICAL HISTORY:   Past Medical History:  Diagnosis Date  . Anxiety   . Atrial flutter ([place])   . Basal cell carcinoma   . Cognitive communication deficit   . Coronary artery disease   . Dementia   . DM (diabetes mellitus) ([place])   . Hypertension   . Insomnia     PAST SURGICAL HISTORY:  History reviewed. No pertinent surgical history.  SOCIAL HISTORY:   Social History  Substance Use Topics  . Smoking status: Former Research scientist (life sciences)  . Smokeless tobacco: Former Systems developer  . Alcohol use No    FAMILY HISTORY:   Family History  Problem Relation Age of Onset  . Family history unknown: Yes    DRUG ALLERGIES:   Allergies  Allergen Reactions  . Penicillins     REVIEW OF SYSTEMS:   Review of Systems  Unable to perform ROS: Dementia    MEDICATIONS AT HOME:   Prior to Admission medications   Medication Sig Start Date End Date Taking? Authorizing Provider  aspirin EC 81 MG tablet Take 81 mg by mouth daily.   Yes  [provider]  atorvastatin (LIPITOR) 80 MG tablet Take 80 mg by mouth daily.   Yes [provider]  chlorhexidine (PERIDEX) 0.12 % solution Use as directed 15 mLs in the mouth or throat daily.   Yes [provider]  citalopram (CELEXA) 20 MG tablet Take 20 mg by mouth daily.   Yes [provider]  lisinopril (PRINIVIL,ZESTRIL) 40 MG tablet Take 40 mg by mouth daily.   Yes [provider]  metoprolol (LOPRESSOR) 50 MG tablet Take 50 mg by mouth daily. Take 50 mg every morning.   Yes [provider]  metoprolol tartrate (LOPRESSOR) 25 MG tablet Take 25 mg by mouth daily. Take 25 mg every evening.   Yes [provider]  oxyCODONE (OXY IR/ROXICODONE) 5 MG immediate release tablet Take 1 tablet (5 mg total) by mouth every 4 (four) hours as needed for severe pain. 12/19/16  Yes Sainani, Belia Heman, MD  sennosides-docusate sodium (SENOKOT-S) 8.6-50 MG tablet Take 1 tablet by mouth daily.   Yes [provider]  vitamin B-12 (CYANOCOBALAMIN) 500 MCG tablet Take 500 mcg by mouth daily.   Yes [provider]     VITAL SIGNS:  Blood pressure (!) 156/82, pulse 67, temperature 97.7 F (36.5 C), temperature source Axillary, resp. rate (!) 22, height 5\' 8"  (1.727 m), weight 74.8 kg (165 lb), SpO2 97 %.  PHYSICAL EXAMINATION:  Physical Exam  GENERAL:  66 y.o.-year-old patient lying in the bed with no acute distress.  EYES: Pupils equal, round, reactive to light and accommodation. No scleral icterus. Extraocular muscles intact.  HEENT: Head atraumatic, normocephalic. Oropharynx and nasopharynx clear. No oropharyngeal erythema, moist oral mucosa  NECK:  Supple, no jugular venous distention. No thyroid enlargement, no tenderness.  LUNGS: Normal breath sounds bilaterally, no wheezing, rales, rhonchi. No use of accessory muscles of respiration.  CARDIOVASCULAR: S1, S2 normal. No murmurs, rubs, or gallops.  ABDOMEN: Soft, nontender,  nondistended. Bowel sounds present. No organomegaly or mass.  EXTREMITIES: No pedal edema, cyanosis, or clubbing. + 2 pedal & radial pulses b/l.   NEUROLOGIC: Moves all 4 extremities. Not following commands. PSYCHIATRIC: The patient is Drowsy. Confused. SKIN: Left forehead hematoma. Skin tears.  LABORATORY PANEL:   CBC  Recent Labs Lab 03/25/17 1229  WBC 9.1  HGB 12.3*  HCT 37.3*  PLT 268   ------------------------------------------------------------------------------------------------------------------  Chemistries   Recent Labs Lab 03/25/17 1229  NA 139  K 4.1  CL 105  CO2 26  GLUCOSE 139*  BUN 23*  CREATININE 1.37*  CALCIUM 9.1  MG 1.9  AST 25  ALT 15*  ALKPHOS 81  BILITOT 0.5   ------------------------------------------------------------------------------------------------------------------  Cardiac Enzymes No results for input(s): TROPONINI in the last 168 hours. ------------------------------------------------------------------------------------------------------------------  RADIOLOGY:  Ct Head Wo Contrast  Result Date: 03/25/2017 CLINICAL DATA:  66 y/o M; unwitnessed fall. Hematoma on the left forehead. EXAM: CT HEAD WITHOUT CONTRAST CT CERVICAL SPINE WITHOUT CONTRAST TECHNIQUE: Multidetector CT imaging of the head and cervical spine was performed following the standard protocol without intravenous contrast. Multiplanar CT image reconstructions of the cervical spine were also generated. COMPARISON:  12/16/2016 CT head. 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 Small chronic infarction in left lateral frontal lobe. No evidence for large territory infarct, acute intracranial hemorrhage, mass effect, or hydrocephalus. Vascular: Extensive calcific atherosclerosis of carotid siphons and vertebrobasilar system. Skull: The left frontal scalp contusion.  No displaced calvarial fracture. Sinuses/Orbits: No acute finding. Other: Left suboccipital subcutaneous stable dermal cyst. CT CERVICAL SPINE FINDINGS Alignment: Straightening of cervical lordosis.  No listhesis. Skull base and vertebrae: No acute fracture. No primary bone lesion or focal pathologic process. Soft tissues and spinal canal: No prevertebral fluid or swelling. No visible canal hematoma. Disc levels: Cervical spondylosis with discogenic degenerative changes greatest at the C5 through C7 levels or uncovertebral and facet hypertrophy contribute to mild to moderate bony foraminal narrowing. No high-grade canal stenosis. Upper chest: Negative. Other: Right greater than left carotid bifurcation calcifications. IMPRESSION: 1. Left frontal scalp hematoma.  No displaced calvarial fracture. 2. No acute intracranial abnormality identified. 3. Multiple stable chronic infarctions as above. 4. No acute fracture or dislocation of cervical spine. 5. Cervical spondylosis greatest at C5 through C7 levels. No high-grade canal stenosis. Electronically Signed   By: Kristine Garbe M.D.   On: 03/25/2017 14:46   Ct Cervical Spine Wo Contrast  Result Date: 03/25/2017 CLINICAL DATA:  66 y/o M; unwitnessed fall. Hematoma on the left forehead. EXAM: CT HEAD WITHOUT CONTRAST CT CERVICAL SPINE WITHOUT CONTRAST TECHNIQUE: Multidetector CT imaging of the head and cervical spine was performed following the standard protocol without intravenous contrast. Multiplanar CT image reconstructions of the cervical spine were also generated. COMPARISON:  12/16/2016 CT head. FINDINGS: CT HEAD FINDINGS Brain: Chronic right PCA infarction in chronic lacunar infarctions in  bilateral thalamus, lentiform nuclei, and left caudate head. Small chronic infarction in left lateral frontal lobe. No evidence for large territory infarct, acute intracranial hemorrhage, mass effect, or hydrocephalus. Vascular: Extensive calcific atherosclerosis of  carotid siphons and vertebrobasilar system. Skull: The left frontal scalp contusion. No displaced calvarial fracture. Sinuses/Orbits: No acute finding. Other: Left suboccipital subcutaneous stable dermal cyst. CT CERVICAL SPINE FINDINGS Alignment: Straightening of cervical lordosis.  No listhesis. Skull base and vertebrae: No acute fracture. No primary bone lesion or focal pathologic process. Soft tissues and spinal canal: No prevertebral fluid or swelling. No visible canal hematoma. Disc levels: Cervical spondylosis with discogenic degenerative changes greatest at the C5 through C7 levels or uncovertebral and facet hypertrophy contribute to mild to moderate bony foraminal narrowing. No high-grade canal stenosis. Upper chest: Negative. Other: Right greater than left carotid bifurcation calcifications. IMPRESSION: 1. Left frontal scalp hematoma.  No displaced calvarial fracture. 2. No acute intracranial abnormality identified. 3. Multiple stable chronic infarctions as above. 4. No acute fracture or dislocation of cervical spine. 5. Cervical spondylosis greatest at C5 through C7 levels. No high-grade canal stenosis. Electronically Signed   By: Kristine Garbe M.D.   On: 03/25/2017 14:46   Dg Chest Port 1 View  Result Date: 03/25/2017 CLINICAL DATA:  Altered mental status with unwitnessed fall EXAM: PORTABLE CHEST 1 VIEW COMPARISON:  12/16/2016 FINDINGS: No acute pulmonary infiltrate, pleural effusion, or pneumothorax. Stable cardiomediastinal silhouette with atherosclerosis. Old appearing left fifth rib deformity. IMPRESSION: No active disease. Electronically Signed   By: Donavan Foil M.D.   On: 03/25/2017 15:51     IMPRESSION AND PLAN:   * Acute encephalopathy with UTI Start IV ceftriaxone. Send for urine cultures. Encephalopathy slowly improving through his ER stay. We'll put him on dysphagia 1 diet. Have PT and speech evaluate. Start IV fluids. Fall precautions. He does have advanced dementia  at baseline but tends to follow commands  * Hypertension. Continue home medications.  * CKD stage III is stable  * DVT prophylaxis with Lovenox  * Fall with left elbow tear and tenderness. We will check an x-ray.  All the records are reviewed and case discussed with ED provider. Management plans discussed with the patient, family and they are in agreement.  CODE STATUS: FULL CODE  TOTAL TIME TAKING CARE OF THIS PATIENT: 40 minutes.   Hillary Bow R M.D on 03/25/2017 at 5:46 PM  Between 7am to 6pm - Pager - [phone]  After 6pm go to [email or web address] - password [email or web address] [facility]  Office  [phone]  CC: Primary care physician; Alvester Morin, MD  Note: This dictation was prepared with Dragon dictation along with smaller phrase technology. Any transcriptional errors that result from this process are unintentional.

## 2017-03-25 NOTE — ED Provider Notes (Signed)
[facility] Emergency Department Provider Note ____________________________________________   First MD Initiated Contact with Patient 03/25/17 1219     (approximate)  I have reviewed the triage vital signs and the nursing notes.   HISTORY  Chief Complaint Fall  EM caveat: Patient with severe alteration in mental status, nonverbal  HPI Joel Davis is a 66 y.o. male the previous history of atrial flutter, coronary disease, dementia and diabetes  Patient presents today after staff noted that he had an unwitnessed fall from the wheelchair. Evidently the patient is nonverbal at baseline, however nursing home documentation does report that he is generally alert and will follow simple commands.  Past Medical History:  Diagnosis Date  . Anxiety   . Atrial flutter ([place])   . Basal cell carcinoma   . Cognitive communication deficit   . Coronary artery disease   . Dementia   . DM (diabetes mellitus) ([place])   . Hypertension   . Insomnia     Patient Active Problem List   Diagnosis Date Noted  . Metabolic encephalopathy 28/31/5176    History reviewed. No pertinent surgical history.  Prior to Admission medications   Medication Sig Start Date End Date Taking? Authorizing Provider  aspirin EC 81 MG tablet Take 81 mg by mouth daily.   Yes [provider]  atorvastatin (LIPITOR) 80 MG tablet Take 80 mg by mouth daily.   Yes [provider]  chlorhexidine (PERIDEX) 0.12 % solution Use as directed 15 mLs in the mouth or throat daily.   Yes [provider]  citalopram (CELEXA) 20 MG tablet Take 20 mg by mouth daily.   Yes [provider]  lisinopril (PRINIVIL,ZESTRIL) 40 MG tablet Take 40 mg by mouth daily.   Yes [provider]  metoprolol (LOPRESSOR) 50 MG tablet Take 50 mg by mouth daily. Take 50 mg every morning.   Yes [provider]  metoprolol tartrate (LOPRESSOR) 25 MG tablet Take 25 mg by mouth  daily. Take 25 mg every evening.   Yes [provider]  oxyCODONE (OXY IR/ROXICODONE) 5 MG immediate release tablet Take 1 tablet (5 mg total) by mouth every 4 (four) hours as needed for severe pain. 12/19/16  Yes Sainani, Belia Heman, MD  sennosides-docusate sodium (SENOKOT-S) 8.6-50 MG tablet Take 1 tablet by mouth daily.   Yes [provider]  vitamin B-12 (CYANOCOBALAMIN) 500 MCG tablet Take 500 mcg by mouth daily.   Yes [provider]    Allergies Penicillins  Family History  Problem Relation Age of Onset  . Family history unknown: Yes    Social History Social History  Substance Use Topics  . Smoking status: Former Research scientist (life sciences)  . Smokeless tobacco: Former Systems developer  . Alcohol use No    Review of Systems EM caveat ____________________________________________   PHYSICAL EXAM:  VITAL SIGNS: ED Triage Vitals  Enc Vitals Group     BP 03/25/17 1205 (!) 139/46     Pulse Rate 03/25/17 1205 (!) 58     Resp 03/25/17 1205 18     Temp 03/25/17 1205 97.7 F (36.5 C)     Temp Source 03/25/17 1205 Axillary     SpO2 03/25/17 1205 97 %     Weight 03/25/17 1206 165 lb (74.8 kg)     Height 03/25/17 1206 5\' 8"  (1.727 m)     Head Circumference --      Peak Flow --      Pain Score --  Pain Loc --      Pain Edu? --      Excl. in [place]? --     Constitutional: Patient denies open, staring forward at the ceiling. Very placid in appearance. Blanks occasionally Eyes: Conjunctivae are normal. PERRL. EOMI. Head: Atraumatic. Nose: No congestion/rhinnorhea. Mouth/Throat: Mucous membranes are dry.  Oropharynx non-erythematous. Neck: No stridor.  No cervical step-offs or deformities noted. Cardiovascular: Normal rate, regular rhythm. Grossly normal heart sounds.  Good peripheral circulation. Respiratory: Normal respiratory effort.  No retractions. Lungs CTAB. Gastrointestinal: Soft and nontender. No distention.  Musculoskeletal: No lower extremity tenderness nor edema.  No  joint effusions. The patient remains flaccid in all extremities. Neurologic:  Stairs forward blankly. Swallows, has intact gag reflex. He is not salivating. He is flaccid in all extremities. Does not follow any commands. Skin:  Skin is warm, dry and intact. No rash noted.   ____________________________________________   LABS (all labs ordered are listed, but only abnormal results are displayed)  Labs Reviewed  CBC - Abnormal; Notable for the following:       Result Value   RBC 3.96 (*)    Hemoglobin 12.3 (*)    HCT 37.3 (*)    RDW 15.5 (*)    All other components within normal limits  BASIC METABOLIC PANEL - Abnormal; Notable for the following:    Glucose, Bld 139 (*)    BUN 23 (*)    Creatinine, Ser 1.37 (*)    GFR calc non Af Amer 51 (*)    GFR calc Af Amer 60 (*)    All other components within normal limits  AMMONIA - Abnormal; Notable for the following:    Ammonia <9 (*)    All other components within normal limits  HEPATIC FUNCTION PANEL - Abnormal; Notable for the following:    ALT 15 (*)    Bilirubin, Direct <0.1 (*)    All other components within normal limits  APTT  PROTIME-INR  MAGNESIUM  URINALYSIS, ROUTINE W REFLEX MICROSCOPIC   ____________________________________________  EKG  Reviewed and interpreted by me at 12:10 PM Heart rate 65 QRS 90 QTc 450 Atrial fibrillation, QT interval slightly prolonged, no evidence of acute ischemia ____________________________________________  RADIOLOGY  Ct Head Wo Contrast  Result Date: 03/25/2017 CLINICAL DATA:  66 y/o M; unwitnessed fall. Hematoma on the left forehead. EXAM: CT HEAD WITHOUT CONTRAST CT CERVICAL SPINE WITHOUT CONTRAST TECHNIQUE: Multidetector CT imaging of the head and cervical spine was performed following the standard protocol without intravenous contrast. Multiplanar CT image reconstructions of the cervical spine were also generated. COMPARISON:  12/16/2016 CT head. FINDINGS: CT HEAD FINDINGS Brain:  Chronic right PCA infarction in chronic lacunar infarctions in bilateral thalamus, lentiform nuclei, and left caudate head. Small chronic infarction in left lateral frontal lobe. No evidence for large territory infarct, acute intracranial hemorrhage, mass effect, or hydrocephalus. Vascular: Extensive calcific atherosclerosis of carotid siphons and vertebrobasilar system. Skull: The left frontal scalp contusion. No displaced calvarial fracture. Sinuses/Orbits: No acute finding. Other: Left suboccipital subcutaneous stable dermal cyst. CT CERVICAL SPINE FINDINGS Alignment: Straightening of cervical lordosis.  No listhesis. Skull base and vertebrae: No acute fracture. No primary bone lesion or focal pathologic process. Soft tissues and spinal canal: No prevertebral fluid or swelling. No visible canal hematoma. Disc levels: Cervical spondylosis with discogenic degenerative changes greatest at the C5 through C7 levels or uncovertebral and facet hypertrophy contribute to mild to moderate bony foraminal narrowing. No high-grade canal stenosis. Upper chest: Negative. Other: Right  greater than left carotid bifurcation calcifications. IMPRESSION: 1. Left frontal scalp hematoma.  No displaced calvarial fracture. 2. No acute intracranial abnormality identified. 3. Multiple stable chronic infarctions as above. 4. No acute fracture or dislocation of cervical spine. 5. Cervical spondylosis greatest at C5 through C7 levels. No high-grade canal stenosis. Electronically Signed   By: Kristine Garbe M.D.   On: 03/25/2017 14:46   Ct Cervical Spine Wo Contrast  Result Date: 03/25/2017 CLINICAL DATA:  66 y/o M; unwitnessed fall. Hematoma on the left forehead. EXAM: CT HEAD WITHOUT CONTRAST CT CERVICAL SPINE WITHOUT CONTRAST TECHNIQUE: Multidetector CT imaging of the head and cervical spine was performed following the standard protocol without intravenous contrast. Multiplanar CT image reconstructions of the cervical spine  were also generated. COMPARISON:  12/16/2016 CT head. 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 Small chronic infarction in left lateral frontal lobe. No evidence for large territory infarct, acute intracranial hemorrhage, mass effect, or hydrocephalus. Vascular: Extensive calcific atherosclerosis of carotid siphons and vertebrobasilar system. Skull: The left frontal scalp contusion. No displaced calvarial fracture. Sinuses/Orbits: No acute finding. Other: Left suboccipital subcutaneous stable dermal cyst. CT CERVICAL SPINE FINDINGS Alignment: Straightening of cervical lordosis.  No listhesis. Skull base and vertebrae: No acute fracture. No primary bone lesion or focal pathologic process. Soft tissues and spinal canal: No prevertebral fluid or swelling. No visible canal hematoma. Disc levels: Cervical spondylosis with discogenic degenerative changes greatest at the C5 through C7 levels or uncovertebral and facet hypertrophy contribute to mild to moderate bony foraminal narrowing. No high-grade canal stenosis. Upper chest: Negative. Other: Right greater than left carotid bifurcation calcifications. IMPRESSION: 1. Left frontal scalp hematoma.  No displaced calvarial fracture. 2. No acute intracranial abnormality identified. 3. Multiple stable chronic infarctions as above. 4. No acute fracture or dislocation of cervical spine. 5. Cervical spondylosis greatest at C5 through C7 levels. No high-grade canal stenosis. Electronically Signed   By: Kristine Garbe M.D.   On: 03/25/2017 14:46    ____________________________________________   PROCEDURES  Procedure(s) performed: None  Procedures  Critical Care performed: No  ____________________________________________   INITIAL IMPRESSION / ASSESSMENT AND PLAN / ED COURSE  Pertinent labs & imaging results that were available during my  care of the patient were reviewed by me and considered in my medical decision making (see chart for details).  Patient presents for evaluation with obtundation after a fall. He does have evidence of a hematoma noted on the left forehead. Patient has altered sensorium, his baseline is noted as being able to follow basic commands. At this time he is obtunded, he stares for but does not speak, he does not following any commands and does not track the examiner. Appears altered sensorium. The time of onset is unclear, he is not seemed to demonstrate any obvious focal abnormality though a detailed neurologic exam is difficult given the patient's inability to participate.  Differential diagnoses broad, I'm concerned the patient has some type of encephalopathy. Examination of the extremities does not demonstrate any gross abnormality or obvious injury.  Patient does have a history of previous dehydration and encephalopathy. Last documented as being at least oriented 1 on discharge. This time he is not oriented at all, or unable to assess orientation  ----------------------------------------- 3:29 PM on 03/25/2017 -----------------------------------------  The patient condition does not change. He continues to respiratory well, but does have some notable rhonchorous type lung sounds no obtain  an x-ray to further evaluate. Condition urinalysis pending. Discussed case with the hospitalist will admit him here, and given his obtunded patient do not feel the patient is stable for a transport to the closest [facility] given the risk for possible decompensation given the unknown cause of potentially rapid change in his mental status. He was noted to be in a wheelchair, but at this point I do not see how it is possible unless there was an acute change that he would be able to maintain himself in a wheelchair at present.         ____________________________________________   FINAL CLINICAL IMPRESSION(S)  / ED DIAGNOSES  Final diagnoses:  Fall, initial encounter  Hematoma  Obtundation      NEW MEDICATIONS STARTED DURING THIS VISIT:  New Prescriptions   No medications on file     Note:  This document was prepared using Dragon voice recognition software and may include unintentional dictation errors.     Delman Kitten, MD 03/25/17 [phone]

## 2017-03-25 NOTE — ED Notes (Signed)
Pt drooling excessively. Pt suctioned and repositioned. Pt resting comfortably at this time.

## 2017-03-25 NOTE — ED Notes (Signed)
Pt now alert and responding to voice. Pt nodded head to question of feeling ok.

## 2017-03-25 NOTE — ED Triage Notes (Signed)
Pt via [facility] from [facility] for c/o unwitnessed fall. Pt is nonverbal. Pt has hematoma on left forehead. Pt has left elbow skin tear. Sent for possible broken left arm. VS-118/49, HR-62, 93% room air. EKG-NSR, CBG-136. Respirations even and unlabored.

## 2017-03-25 NOTE — ED Notes (Signed)
Patient transported to CT 

## 2017-03-26 LAB — CBC
HCT: 34.6 % — ABNORMAL LOW (ref 40.0–52.0)
HEMOGLOBIN: 11.5 g/dL — AB (ref 13.0–18.0)
MCH: 31.3 pg (ref 26.0–34.0)
MCHC: 33.2 g/dL (ref 32.0–36.0)
MCV: 94.4 fL (ref 80.0–100.0)
Platelets: 228 10*3/uL (ref 150–440)
RBC: 3.67 MIL/uL — AB (ref 4.40–5.90)
RDW: 15.5 % — ABNORMAL HIGH (ref 11.5–14.5)
WBC: 7.8 10*3/uL (ref 3.8–10.6)

## 2017-03-26 LAB — BASIC METABOLIC PANEL
ANION GAP: 7 (ref 5–15)
BUN: 18 mg/dL (ref 6–20)
CALCIUM: 8.6 mg/dL — AB (ref 8.9–10.3)
CO2: 27 mmol/L (ref 22–32)
Chloride: 109 mmol/L (ref 101–111)
Creatinine, Ser: 1.27 mg/dL — ABNORMAL HIGH (ref 0.61–1.24)
GFR, EST NON AFRICAN AMERICAN: 56 mL/min — AB (ref >60)
Glucose, Bld: 107 mg/dL — ABNORMAL HIGH (ref 65–99)
Potassium: 3.9 mmol/L (ref 3.5–5.1)
Sodium: 143 mmol/L (ref 135–145)

## 2017-03-26 LAB — GLUCOSE, CAPILLARY
GLUCOSE-CAPILLARY: 121 mg/dL — AB (ref 65–99)
GLUCOSE-CAPILLARY: 99 mg/dL (ref 65–99)
GLUCOSE-CAPILLARY: 107 mg/dL — AB (ref 65–99)
Glucose-Capillary: 105 mg/dL — ABNORMAL HIGH (ref 65–99)
Glucose-Capillary: 142 mg/dL — ABNORMAL HIGH (ref 65–99)

## 2017-03-26 LAB — HEMOGLOBIN A1C
HEMOGLOBIN A1C: 7.2 % — AB (ref 4.8–5.6)
MEAN PLASMA GLUCOSE: 160 mg/dL

## 2017-03-26 MED ORDER — SODIUM CHLORIDE 0.9 % IV SOLN: INTRAVENOUS | Status: DC

## 2017-03-26 MED ORDER — SODIUM CHLORIDE 0.9 % IV SOLN
INTRAVENOUS | Status: DC
  Administered 2017-03-26 – 2017-03-27 (×2): via INTRAVENOUS
  Administered 2017-03-29: 50 mL/h via INTRAVENOUS

## 2017-03-26 MED ORDER — DEXTROSE 5 % IV SOLN
1.0000 g | INTRAVENOUS | Status: DC
  Administered 2017-03-26 – 2017-03-29 (×4): 1 g via INTRAVENOUS
  Filled 2017-03-26 (×5): qty 10

## 2017-03-26 NOTE — Progress Notes (Signed)
CCMD advised nurse that patient converted to a flutter at 1017.  Advised Dr. Darvin Neighbours.

## 2017-03-26 NOTE — Clinical Social Work Note (Signed)
Clinical Social Work Assessment  Patient Details  Name: Joel Davis MRN: [id number] Date of Birth: Mar 30, 1951  Date of referral:  03/26/17               Reason for consult:  Facility Placement                Permission sought to share information with:    Permission granted to share information::     Name::        Agency::     Relationship::     Contact Information:     Housing/Transportation Living arrangements for the past 2 months:  [place] of Information:  Facility Patient Interpreter Needed:  None Criminal Activity/Legal Involvement Pertinent to Current Situation/Hospitalization:  No - Comment as needed 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  Yes Need for family participation in patient care:  Yes (Comment)  Care giving concerns:  Patient admitted from [facility] Worker assessment / plan:  The CSW attempted to meet with family at bedside, but none were present. The CSW attempted to contact family via phone numbers listed; the CSW left voice mail. The CSW gained all information from the facility.  At baseline, the patient is non-verbal but does respond to basic instructions. The patient is wheelchair bound, and he was admitted after an unwitnessed fall. The patient has a UTI at this time which may account for the underlying confusion.  The patient can return to [facility] once he is stable where he is an LTC resident. The CSW will continue to follow to facilitate discharge.  Employment status:  Retired Forensic scientist:  Medicare PT Recommendations:  Not assessed at this time Information / Referral to community resources:     Patient/Family's Response to care:  The patient is nonverbal, and the family was not available.  Patient/Family's Understanding of and Emotional Response to Diagnosis, Current Treatment,  and Prognosis:  The patient is nonverbal, and the family was not available.  Emotional Assessment Appearance:  Appears stated age Attitude/Demeanor/Rapport:  Lethargic Affect (typically observed):  Unable to Assess Orientation:   (Patient is confused) Alcohol / Substance use:  Never Used Psych involvement (Current and /or in the community):  No (Comment)  Discharge Needs  Concerns to be addressed:  Care Coordination, Discharge Planning Concerns Readmission within the last 30 days:  No Current discharge risk:  Chronically ill, Cognitively Impaired Barriers to Discharge:  Continued Medical Work up   [employer], LCSW 03/26/2017, 2:37 PM

## 2017-03-26 NOTE — Progress Notes (Signed)
This nurse called Dr. Darvin Neighbours to see if he would like fluids to run, since order ran out. He confirmed to continue fluids at 46ml/hr.

## 2017-03-26 NOTE — Evaluation (Signed)
Clinical/Bedside Swallow Evaluation Patient Details  Name: Joel Davis MRN: [id number] Date of Birth: 02/08/1951  Today's Date: 03/26/2017 Time: SLP Start Time (ACUTE ONLY): 1155 SLP Stop Time (ACUTE ONLY): 1255 SLP Time Calculation (min) (ACUTE ONLY): 60 min  Past Medical History:  Past Medical History:  Diagnosis Date  . Anxiety   . Atrial flutter ([place])   . Basal cell carcinoma   . Cognitive communication deficit   . Coronary artery disease   . Dementia   . DM (diabetes mellitus) ([place])   . Hypertension   . Insomnia    Past Surgical History: History reviewed. No pertinent surgical history. HPI:  Pt is a 66 y.o. male with a known history of hypertension, diabetes, Dementia w/ cognitive communication deficits from local nursing home due to worsening mental status. At baseline patient follows commands and is able to utter one or 2 words. But he has been found to be increasingly lethargic. Initially when he arrived in the emergency room he was completely nonverbal. Some improvement occurred. He does have a UTI with severe acute encephalopathy over his dementia and is being admitted to the hospitalist service. Pt does have Dysphagia baseline per family report and is on a dysphagia diet level 1(puree) w/ HONEY consistency liquids. Family also reports episodes of coughing w/ this diet and stated pt was scheduled to have a MBSS at the [place] next month(?). Pt appears to require full support w/ any oral intake.   Assessment / Plan / Recommendation Clinical Impression  Pt appears to present w/ increased risk for aspriation secondary to oropharyngeal phase dysphagia; pharyngeal phase deficits were noted w/ TSP trials of Honey consistency liquids despite aspiration precautions. Pt does have a baseline h/o dysphagia and is on a puree/Honey consistency liquid diet at the NH per family report. He has been exhibiting coughing during meals despite the modified diet. Pt exhibited slower oral phase bolus  management overall w/ multiple swallowing (x2) to fully clear all bolus trials fed to him via TSP. However, d/t the overt coughing w/ the TSP trials of Honey consistency liquids, pt is recommended to be on Pudding consistency liquids w/ f/u w/ MBSS on Monday to more fully assess oropharyngeal phase dysphagia. Recommend strict aspiration precautions; pills in Puree; assistance w/ feeding at meals, monitoring.  SLP Visit Diagnosis: Dysphagia, oropharyngeal phase (R13.12)    Aspiration Risk  Moderate aspiration risk;Severe aspiration risk    Diet Recommendation  Dysphagia level 1(puree) w/ Pudding consistency liquids; strict aspiration precautions to include Pudding liquids VIA TSP only. Feeding support.   Medication Administration: Crushed with puree    Other  Recommendations Recommended Consults:  (Dietician f/u; Palliative Care f/u) Oral Care Recommendations: Oral care BID;Staff/trained caregiver to provide oral care Other Recommendations: Order thickener from pharmacy;Prohibited food (jello, ice cream, thin soups);Remove water pitcher;Have oral suction available   Follow up Recommendations Skilled Nursing facility      Frequency and Duration  (TBD)   (TBD)       Prognosis Prognosis for Safe Diet Advancement: Fair Barriers to Reach Goals: Cognitive deficits;Severity of deficits      Swallow Study   General Date of Onset: 03/25/17 HPI: Pt is a 66 y.o. male with a known history of hypertension, diabetes, Dementia w/ cognitive communication deficits from local nursing home due to worsening mental status. At baseline patient follows commands and is able to utter one or 2 words. But he has been found to be increasingly lethargic. Initially when he arrived in the emergency room  he was completely nonverbal. Some improvement occurred. He does have a UTI with severe acute encephalopathy over his dementia and is being admitted to the hospitalist service. Pt does have Dysphagia baseline per family  report and is on a dysphagia diet level 1(puree) w/ HONEY consistency liquids. Family also reports episodes of coughing w/ this diet and stated pt was scheduled to have a MBSS at the [place] next month(?). Pt appears to require full support w/ any oral intake. Type of Study: Bedside Swallow Evaluation Previous Swallow Assessment: on a dysphagia diet baseline at NH per family Diet Prior to this Study: Dysphagia 1 (puree);Honey-thick liquids Temperature Spikes Noted: No (wbc 7.8) Respiratory Status: Nasal cannula (2 liters) History of Recent Intubation: No Behavior/Cognition: Cooperative;Pleasant mood;Confused;Requires cueing (awake) Oral Cavity Assessment:  (difficult to assess d/t cognitive status) Oral Care Completed by SLP: Recent completion by staff Oral Cavity - Dentition: Poor condition;Missing dentition Vision:  (n/a) Self-Feeding Abilities: Total assist Patient Positioning: Upright in bed Baseline Vocal Quality:  (nonverbal w/ SLP) Volitional Cough: Cognitively unable to elicit Volitional Swallow: Unable to elicit    Oral/Motor/Sensory Function Overall Oral Motor/Sensory Function:  (no unilateral deficits noted; slower oral movements )   Ice Chips Ice chips: Not tested   Thin Liquid Thin Liquid: Not tested    Nectar Thick Nectar Thick Liquid: Not tested   Honey Thick Honey Thick Liquid: Impaired Presentation: Spoon (1 full tsp; 1 half tsp) Oral Phase Impairments:  (slower lingual/labial movements) Oral Phase Functional Implications: Prolonged oral transit Pharyngeal Phase Impairments: Multiple swallows;Cough - Immediate (x2/2 trials total)   Puree Puree: Impaired Presentation: Spoon (fed; 15+ trials) Oral Phase Impairments:  (slower lingual/labial movements overall) Oral Phase Functional Implications: Prolonged oral transit Pharyngeal Phase Impairments: Multiple swallows (no coughing or throat clearing noted)   Solid   GO   Solid: Not tested         Joel Kenner, MS,  CCC-SLP Joel Davis 03/26/2017,2:23 PM

## 2017-03-26 NOTE — Progress Notes (Signed)
PT Cancellation Note  Patient Details Name: Joel Davis MRN: [id number] DOB: 07/23/1951   Cancelled Treatment:    Reason Eval/Treat Not Completed: Fatigue/lethargy limiting ability to participate. PT entered room at 0838, and patient asleep. Able to wake temporarily with him oriented x1. Unable to keep eyes open or follow commands at this time. Will check back later if time permits to complete evaluation.   Dorice Lamas, PT, DPT 03/26/2017, 8:46 AM

## 2017-03-26 NOTE — Progress Notes (Signed)
[facility] [place] NAME: Joel Davis    MR#:  [id number]  DATE OF BIRTH:  1951-01-10  SUBJECTIVE:  CHIEF COMPLAINT:   Chief Complaint  Patient presents with  . Fall   Still confused. Refused meds  REVIEW OF SYSTEMS:    Review of Systems  Unable to perform ROS: Dementia    DRUG ALLERGIES:   Allergies  Allergen Reactions  . Penicillins     VITALS:  Blood pressure (!) 154/63, pulse 62, temperature 97.8 F (36.6 C), temperature source Axillary, resp. rate 17, height 5\' 8"  (1.727 m), weight 78.2 kg (172 lb 4.8 oz), SpO2 100 %.  PHYSICAL EXAMINATION:   Physical Exam  GENERAL:  66 y.o.-year-old patient lying in the bed with no acute distress.  EYES: Pupils equal, round, reactive to light and accommodation. No scleral icterus. Extraocular muscles intact.  HEENT: Head atraumatic, normocephalic. Oropharynx and nasopharynx clear.  NECK:  Supple, no jugular venous distention. No thyroid enlargement, no tenderness.  LUNGS: Coarse breath sounds CARDIOVASCULAR: S1, S2 normal. No murmurs, rubs, or gallops.  ABDOMEN: Soft, nontender, nondistended. Bowel sounds present. No organomegaly or mass.  EXTREMITIES: No cyanosis, clubbing or edema b/l.    NEUROLOGIC: Moves all 4 extremities but doesn't follow commands PSYCHIATRIC: The patient is alert and awake. Non verbal. SKIN: No obvious rash, lesion, or ulcer.   LABORATORY PANEL:   CBC  Recent Labs Lab 03/26/17 0423  WBC 7.8  HGB 11.5*  HCT 34.6*  PLT 228   ------------------------------------------------------------------------------------------------------------------ Chemistries   Recent Labs Lab 03/25/17 1229 03/26/17 0423  NA 139 143  K 4.1 3.9  CL 105 109  CO2 26 27  GLUCOSE 139* 107*  BUN 23* 18  CREATININE 1.37* 1.27*  CALCIUM 9.1 8.6*  MG 1.9  --   AST 25  --   ALT 15*  --   ALKPHOS 81  --   BILITOT 0.5  --     ------------------------------------------------------------------------------------------------------------------  Cardiac Enzymes No results for input(s): TROPONINI in the last 168 hours. ------------------------------------------------------------------------------------------------------------------  RADIOLOGY:  Dg Elbow 2 Views Left  Result Date: 03/25/2017 CLINICAL DATA:  Left elbow laceration, initial encounter EXAM: LEFT ELBOW - 2 VIEW COMPARISON:  None. FINDINGS: Soft tissue irregularity consistent with the given history is noted posteriorly. No acute fracture or dislocation is seen. No radiopaque foreign body is noted. IMPRESSION: Soft tissue injury without acute bony abnormality. Electronically Signed   By: Inez Catalina M.D.   On: 03/25/2017 18:17   Ct Head Wo Contrast  Result Date: 03/25/2017 CLINICAL DATA:  66 y/o M; unwitnessed fall. Hematoma on the left forehead. EXAM: CT HEAD WITHOUT CONTRAST CT CERVICAL SPINE WITHOUT CONTRAST TECHNIQUE: Multidetector CT imaging of the head and cervical spine was performed following the standard protocol without intravenous contrast. Multiplanar CT image reconstructions of the cervical spine were also generated. COMPARISON:  12/16/2016 CT head. 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 Small chronic infarction in left lateral frontal lobe. No evidence for large territory infarct, acute intracranial hemorrhage, mass effect, or hydrocephalus. Vascular: Extensive calcific atherosclerosis of carotid siphons and vertebrobasilar system. Skull: The left frontal scalp contusion. No displaced calvarial fracture. Sinuses/Orbits: No acute finding. Other: Left suboccipital subcutaneous stable dermal cyst. CT CERVICAL SPINE FINDINGS Alignment: Straightening of cervical lordosis.  No listhesis. Skull base and vertebrae: No acute fracture. No primary  bone lesion or focal pathologic  process. Soft tissues and spinal canal: No prevertebral fluid or swelling. No visible canal hematoma. Disc levels: Cervical spondylosis with discogenic degenerative changes greatest at the C5 through C7 levels or uncovertebral and facet hypertrophy contribute to mild to moderate bony foraminal narrowing. No high-grade canal stenosis. Upper chest: Negative. Other: Right greater than left carotid bifurcation calcifications. IMPRESSION: 1. Left frontal scalp hematoma.  No displaced calvarial fracture. 2. No acute intracranial abnormality identified. 3. Multiple stable chronic infarctions as above. 4. No acute fracture or dislocation of cervical spine. 5. Cervical spondylosis greatest at C5 through C7 levels. No high-grade canal stenosis. Electronically Signed   By: Kristine Garbe M.D.   On: 03/25/2017 14:46   Ct Cervical Spine Wo Contrast  Result Date: 03/25/2017 CLINICAL DATA:  66 y/o M; unwitnessed fall. Hematoma on the left forehead. EXAM: CT HEAD WITHOUT CONTRAST CT CERVICAL SPINE WITHOUT CONTRAST TECHNIQUE: Multidetector CT imaging of the head and cervical spine was performed following the standard protocol without intravenous contrast. Multiplanar CT image reconstructions of the cervical spine were also generated. COMPARISON:  12/16/2016 CT head. 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 Small chronic infarction in left lateral frontal lobe. No evidence for large territory infarct, acute intracranial hemorrhage, mass effect, or hydrocephalus. Vascular: Extensive calcific atherosclerosis of carotid siphons and vertebrobasilar system. Skull: The left frontal scalp contusion. No displaced calvarial fracture. Sinuses/Orbits: No acute finding. Other: Left suboccipital subcutaneous stable dermal cyst. CT CERVICAL SPINE FINDINGS Alignment: Straightening of  cervical lordosis.  No listhesis. Skull base and vertebrae: No acute fracture. No primary bone lesion or focal pathologic process. Soft tissues and spinal canal: No prevertebral fluid or swelling. No visible canal hematoma. Disc levels: Cervical spondylosis with discogenic degenerative changes greatest at the C5 through C7 levels or uncovertebral and facet hypertrophy contribute to mild to moderate bony foraminal narrowing. No high-grade canal stenosis. Upper chest: Negative. Other: Right greater than left carotid bifurcation calcifications. IMPRESSION: 1. Left frontal scalp hematoma.  No displaced calvarial fracture. 2. No acute intracranial abnormality identified. 3. Multiple stable chronic infarctions as above. 4. No acute fracture or dislocation of cervical spine. 5. Cervical spondylosis greatest at C5 through C7 levels. No high-grade canal stenosis. Electronically Signed   By: Kristine Garbe M.D.   On: 03/25/2017 14:46   Dg Chest Port 1 View  Result Date: 03/25/2017 CLINICAL DATA:  Altered mental status with unwitnessed fall EXAM: PORTABLE CHEST 1 VIEW COMPARISON:  12/16/2016 FINDINGS: No acute pulmonary infiltrate, pleural effusion, or pneumothorax. Stable cardiomediastinal silhouette with atherosclerosis. Old appearing left fifth rib deformity. IMPRESSION: No active disease. Electronically Signed   By: Donavan Foil M.D.   On: 03/25/2017 15:51     ASSESSMENT AND PLAN:   * Acute encephalopathy with UTI Started IV ceftriaxone. U cx pending. Still confused. We need to know what his baseline is. Tried calling family with the phone numbers available but no response. PT and speech to see. CT head- Nothing acute He does have advanced dementia at baseline but tends to follow commands per notes  * Hypertension. Continue home medications.  * CKD stage III is stable  * DVT prophylaxis with Lovenox  All the records are reviewed and case discussed with Care Management/Social  Workerr. Management plans discussed with the patient, family and they are in agreement.  CODE STATUS: FULL CODE  DVT Prophylaxis: SCDs  TOTAL TIME TAKING CARE OF THIS PATIENT: 30 minutes.  POSSIBLE D/C IN 1-2 DAYS, DEPENDING ON CLINICAL CONDITION.  Hillary Bow R M.D on 03/26/2017 at 12:19 PM  Between 7am to 6pm - Pager - [phone]  After 6pm go to [email or web address] - password [email or web address] [facility]  Office  [phone]  CC: Primary care physician; Alvester Morin, MD  Note: This dictation was prepared with Dragon dictation along with smaller phrase technology. Any transcriptional errors that result from this process are unintentional.

## 2017-03-26 NOTE — Progress Notes (Signed)
PT Cancellation Note  Patient Details Name: Kyrollos Cordell MRN: [id number] DOB: 04-11-1951   Cancelled Treatment:    Reason Eval/Treat Not Completed: Patient not medically ready. Spoke with RN about patient who recommended holding on evaluation due to PLOF and current cardiovascular-related issues. Will check back at later date.   Dorice Lamas, PT, DPT 03/26/2017, 11:36 AM

## 2017-03-26 NOTE — Progress Notes (Signed)
Initial Nutrition Assessment  DOCUMENTATION CODES:   Non-severe (moderate) malnutrition in context of chronic illness  INTERVENTION:  1. Magic cup TID with meals, each supplement provides 290 kcal and 9 grams of protein  NUTRITION DIAGNOSIS:   Malnutrition (Moderate) related to chronic illness (Vascular dementia) as evidenced by moderate depletion of body fat, moderate depletions of muscle mass.  GOAL:   Patient will meet greater than or equal to 90% of their needs  MONITOR:   PO intake, Supplement acceptance, Labs, I & O's, Weight trends  REASON FOR ASSESSMENT:   Malnutrition Screening Tool    ASSESSMENT:   Joel Davis  is a 66 y.o. male with a known history of Hypertension, diabetes, dementia syndrome from local nursing home due to worsening mental status  Patient with vascular dementia, unable to provide any history. He says "yes" or "no" to things inerrantly. Patient has moderate muscle wasting and moderate fat depletions, no signs of edema at this time. Documented PO intake 0% this morning. Per chart, weight has been stable. Labs and medications reviewed: Senokot-S  Diet Order:  DIET - DYS 1 Room service appropriate? Yes; Fluid consistency: Honey Thick  Skin:  Reviewed, no issues  Last BM:  PTA  Height:   Ht Readings from Last 1 Encounters:  03/25/17 5\' 8"  (1.727 m)    Weight:   Wt Readings from Last 1 Encounters:  03/26/17 172 lb 4.8 oz (78.2 kg)    Ideal Body Weight:  70 kg  BMI:  Body mass index is 26.2 kg/m.  Estimated Nutritional Needs:   Kcal:  1550-2000 calories  Protein:  78-94 grams  Fluid:  >/= 1.6L  EDUCATION NEEDS:   No education needs identified at this time  Satira Anis. Georgann Bramble, MS, RD LDN Inpatient Clinical Dietitian Pager [phone]

## 2017-03-26 NOTE — Progress Notes (Signed)
This nurse atttempted to give patient morning meds with applesauce however patient was unable to swallow without choking.

## 2017-03-26 NOTE — NC FL2 (Signed)
[place] LEVEL OF CARE SCREENING TOOL     IDENTIFICATION  Patient Name: Joel Davis Birthdate: 09-Jul-1951 Sex: male Admission Date (Current Location): 03/25/2017  [place] and [place] Number:  Engineering geologist and Address:  [facility], [address], [place], [place] [address]      Provider Number: [id number]  Attending Physician Name and Address:  Hillary Bow, MD  Relative Name and Phone Number:       Current Level of Care: Hospital Recommended Level of Care: [place] Prior Approval Number:    Date Approved/Denied:   PASRR Number: [id number]  Discharge Plan: SNF    Current Diagnoses: Patient Active Problem List   Diagnosis Date Noted  . UTI (urinary tract infection) 03/25/2017  . Metabolic encephalopathy 25/00/3704    Orientation RESPIRATION BLADDER Height & Weight        O2 (2L O2) Incontinent Weight: 172 lb 4.8 oz (78.2 kg) Height:  5\' 8"  (172.7 cm)  BEHAVIORAL SYMPTOMS/MOOD NEUROLOGICAL BOWEL NUTRITION STATUS      Incontinent Diet (Dysphagia 1; honey-thick liquids)  AMBULATORY STATUS COMMUNICATION OF NEEDS Skin   Total Care Non-Verbally Skin abrasions, Bruising (Left elbow skin tear; hematoma on forehead)                       Personal Care Assistance Level of Assistance  Bathing, Feeding, Dressing Bathing Assistance: Maximum assistance Feeding assistance: Limited assistance Dressing Assistance: Maximum assistance     Functional Limitations Info  Speech     Speech Info: Impaired (Patient is nonverbal)    SPECIAL CARE FACTORS FREQUENCY                       Contractures Contractures Info: Present    Additional Factors Info  Allergies   Allergies Info: Penicillins           Current Medications (03/26/2017):  This is the current hospital active medication list Current Facility-Administered Medications  Medication Dose Route Frequency Provider Last Rate Last  Dose  . acetaminophen (TYLENOL) tablet 650 mg  650 mg Oral Q6H PRN Hillary Bow, MD       Or  . acetaminophen (TYLENOL) suppository 650 mg  650 mg Rectal Q6H PRN Sudini, Srikar, MD      . albuterol (PROVENTIL) (2.5 MG/3ML) 0.083% nebulizer solution 2.5 mg  2.5 mg Nebulization Q2H PRN Sudini, Alveta Heimlich, MD      . aspirin EC tablet 81 mg  81 mg Oral Daily Sudini, Srikar, MD      . atorvastatin (LIPITOR) tablet 80 mg  80 mg Oral Daily Sudini, Srikar, MD      . cefTRIAXone (ROCEPHIN) 1 g in dextrose 5 % 50 mL IVPB  1 g Intravenous Q24H Sudini, Srikar, MD      . chlorhexidine (PERIDEX) 0.12 % solution 15 mL  15 mL Mouth/Throat Daily Sudini, Srikar, MD      . citalopram (CELEXA) tablet 20 mg  20 mg Oral Daily Sudini, Srikar, MD      . enoxaparin (LOVENOX) injection 40 mg  40 mg Subcutaneous Q24H Hillary Bow, MD   40 mg at 03/26/17 0044  . insulin aspart (novoLOG) injection 0-9 Units  0-9 Units Subcutaneous Q4H Hillary Bow, MD   1 Units at 03/26/17 [phone]  . lisinopril (PRINIVIL,ZESTRIL) tablet 40 mg  40 mg Oral Daily Sudini, Srikar, MD      . metoprolol tartrate (LOPRESSOR) tablet 25 mg  25 mg  Oral q1800 Hillary Bow, MD      . metoprolol tartrate (LOPRESSOR) tablet 50 mg  50 mg Oral Daily Sudini, Srikar, MD      . ondansetron (ZOFRAN) tablet 4 mg  4 mg Oral Q6H PRN Hillary Bow, MD       Or  . ondansetron (ZOFRAN) injection 4 mg  4 mg Intravenous Q6H PRN Sudini, Srikar, MD      . oxyCODONE (Oxy IR/ROXICODONE) immediate release tablet 5 mg  5 mg Oral Q4H PRN Sudini, Srikar, MD      . polyethylene glycol (MIRALAX / GLYCOLAX) packet 17 g  17 g Oral Daily PRN Sudini, Alveta Heimlich, MD      . senna-docusate (Senokot-S) tablet 1 tablet  1 tablet Oral Daily Hillary Bow, MD         Discharge Medications: Please see discharge summary for a list of discharge medications.  Relevant Imaging Results:  Relevant Lab Results:   Additional Information [phone]  Zettie Pho, LCSW

## 2017-03-26 NOTE — Progress Notes (Signed)
Spoke to [employer] at [facility] regarding patient. She told this nurse that patient has vascular dementia and that he is totally dependent. He has severe choking problems and that he is on a pureed/honey thick diet at [facility].

## 2017-03-27 LAB — GLUCOSE, CAPILLARY
GLUCOSE-CAPILLARY: 107 mg/dL — AB (ref 65–99)
GLUCOSE-CAPILLARY: 168 mg/dL — AB (ref 65–99)
Glucose-Capillary: 92 mg/dL (ref 65–99)
Glucose-Capillary: 94 mg/dL (ref 65–99)
Glucose-Capillary: 150 mg/dL — ABNORMAL HIGH (ref 65–99)
Glucose-Capillary: 142 mg/dL — ABNORMAL HIGH (ref 65–99)

## 2017-03-27 MED ORDER — LORAZEPAM 2 MG/ML IJ SOLN: 0.5000 mg | Freq: Once | INTRAMUSCULAR | Status: DC | PRN

## 2017-03-27 NOTE — Progress Notes (Signed)
Spoke to MRI today. Technician could not get a hold of family member to talk with them about test for patient. Tech said they would try again tomorrow.

## 2017-03-27 NOTE — Progress Notes (Signed)
Physical Therapy Evaluation Patient Details Name: Joel Davis MRN: [id number] DOB: [birth year] Today's Date: 03/27/2017   History of Present Illness  Patient is a 66 y.o. male admitted on 25 May after experiencing worsening mental status and fall, injuring soft tissue of L elbow. Patient lives at [facility] in [facility]. PMH includes dementia, HTN, DM, and CKD III.  Clinical Impression  Patient alert today upon PT arrival for evaluation, oriented x1. Subjective information obtained from RN and previous notes. Patient previously living at [facility] in long-term care facility. On evaluation, he demonstrates inconsistent yes/no responses and inconsistent ability to follow commands. Was able to roll in bed with moderate cues/assistance but was unable to move into sitting at EOB. Patient will continue to benefit from strengthening and training in functional mobility to return to baseline level of function.    Follow Up Recommendations LTACH    Equipment Recommendations  None recommended by PT    Recommendations for Other Services       Precautions / Restrictions Precautions Precautions: Fall Restrictions Weight Bearing Restrictions: No Other Position/Activity Restrictions: Non-ambulatory      Mobility  Bed Mobility Overal bed mobility: Needs Assistance Bed Mobility: Rolling Rolling: Mod assist         General bed mobility comments: Patient demonstrates ability to roll L/R with moderate cues and assistance. Unable to push into sitting at EOB.  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    Prior Function Level of Independence: Needs assistance   Gait / Transfers Assistance Needed: Non-ambulatory  ADL's / Homemaking Assistance Needed: Total assistance        Hand Dominance        Extremity/Trunk Assessment   Upper Extremity Assessment Upper Extremity Assessment: Generalized weakness;LUE deficits/detail;Difficult to assess due to impaired cognition LUE Deficits / Details: Unable to A/PROM shoulder flexion; laceration L elbow    Lower Extremity Assessment Lower Extremity Assessment: Generalized weakness;Difficult to assess due to impaired cognition (Generally 3 to 3+/5 strength)       Communication   Communication: Expressive difficulties  Cognition Arousal/Alertness: Awake/alert Behavior During Therapy: WFL for tasks assessed/performed;Flat affect Overall Cognitive Status: History of cognitive impairments - at baseline                                 General Comments: Patient oriented x1. Able to answer some yes/no questions but inconsistent.      General Comments      Exercises     Assessment/Plan    PT Assessment Patient needs continued PT services  PT Problem List Decreased strength;Decreased activity tolerance;Decreased balance;Decreased mobility;Decreased range of motion;Decreased cognition;Decreased knowledge of use of DME;Decreased safety awareness;Decreased skin integrity       PT Treatment Interventions DME instruction;Functional mobility training;Therapeutic activities;Therapeutic exercise;Balance training;Cognitive remediation;Patient/family education;Wheelchair mobility training    PT Goals (Current goals can be found in the Care Plan section)  Acute Rehab PT Goals Patient Stated Goal: Unstated PT Goal Formulation: Patient unable to participate in goal setting Time For Goal Achievement: 04/10/17 Potential to Achieve Goals: Fair    Frequency Min 2X/week    Barriers to discharge        Co-evaluation               AM-PAC PT "6 Clicks" Daily Activity  Outcome Measure Difficulty turning over in bed (including adjusting bedclothes, sheets and blankets)?: A Lot Difficulty moving from lying on back to sitting on the side of the bed? : Total Difficulty sitting down on and standing up from a chair with arms (e.g., wheelchair, bedside commode, etc,.)?: Total Help needed moving to and from a bed to chair (including a wheelchair)?: Total Help needed walking in hospital room?: Total Help needed climbing 3-5 steps with a railing? : Total 6 Click Score: 7    End of Session   Activity Tolerance: Patient limited by fatigue Patient left: in bed;with call bell/phone within reach;with bed alarm set   PT Visit Diagnosis: Muscle weakness (generalized) (M62.81)    Time: 4562-5638 PT Time Calculation (min) (ACUTE ONLY): 13 min   Charges:   PT Evaluation $PT Eval Low Complexity: 1 Procedure     PT G Codes:          Dorice Lamas, PT, DPT 03/27/2017, 9:49 AM

## 2017-03-27 NOTE — Progress Notes (Signed)
Dinner this evening patient held food in his mouth after a spoonful and would not swallow. Spit food out after trials of different choices. I asked him if he was hungry and he shook head no.

## 2017-03-27 NOTE — Progress Notes (Signed)
[place] at [place] NAME: Joel Davis    MR#:  [id number]  DATE OF BIRTH:  25-Dec-1950  SUBJECTIVE:  CHIEF COMPLAINT:   Chief Complaint  Patient presents with  . Fall   Still confused. Nonverbal when I saw the patient. He was speaking in 1-2 words with the nurse later.  REVIEW OF SYSTEMS:    Review of Systems  Unable to perform ROS: Dementia    DRUG ALLERGIES:   Allergies  Allergen Reactions  . Penicillins     VITALS:  Blood pressure (!) 139/51, pulse (!) 56, temperature 98.7 F (37.1 C), temperature source Oral, resp. rate 18, height 5\' 8"  (1.727 m), weight 76.6 kg (168 lb 12.8 oz), SpO2 96 %.  PHYSICAL EXAMINATION:   Physical Exam  GENERAL:  66 y.o.-year-old patient lying in the bed with no acute distress.  EYES: Pupils equal, round, reactive to light and accommodation. No scleral icterus. Extraocular muscles intact.  HEENT: Head atraumatic, normocephalic. Oropharynx and nasopharynx clear.  NECK:  Supple, no jugular venous distention. No thyroid enlargement, no tenderness.  LUNGS: Coarse breath sounds CARDIOVASCULAR: S1, S2 normal. No murmurs, rubs, or gallops.  ABDOMEN: Soft, nontender, nondistended. Bowel sounds present. No organomegaly or mass.  EXTREMITIES: No cyanosis, clubbing or edema b/l.    NEUROLOGIC: Moves all 4 extremities but doesn't follow commands Consistently PSYCHIATRIC: The patient is alert and awake. Non verbal. SKIN: No obvious rash, lesion, or ulcer.   LABORATORY PANEL:   CBC  Recent Labs Lab 03/26/17 0423  WBC 7.8  HGB 11.5*  HCT 34.6*  PLT 228   ------------------------------------------------------------------------------------------------------------------ Chemistries   Recent Labs Lab 03/25/17 1229 03/26/17 0423  NA 139 143  K 4.1 3.9  CL 105 109  CO2 26 27  GLUCOSE 139* 107*  BUN 23* 18  CREATININE 1.37* 1.27*  CALCIUM 9.1 8.6*  MG 1.9  --   AST 25  --   ALT 15*  --    ALKPHOS 81  --   BILITOT 0.5  --    ------------------------------------------------------------------------------------------------------------------  Cardiac Enzymes No results for input(s): TROPONINI in the last 168 hours. ------------------------------------------------------------------------------------------------------------------  RADIOLOGY:  Dg Elbow 2 Views Left  Result Date: 03/25/2017 CLINICAL DATA:  Left elbow laceration, initial encounter EXAM: LEFT ELBOW - 2 VIEW COMPARISON:  None. FINDINGS: Soft tissue irregularity consistent with the given history is noted posteriorly. No acute fracture or dislocation is seen. No radiopaque foreign body is noted. IMPRESSION: Soft tissue injury without acute bony abnormality. Electronically Signed   By: Inez Catalina M.D.   On: 03/25/2017 18:17   Ct Head Wo Contrast  Result Date: 03/25/2017 CLINICAL DATA:  66 y/o M; unwitnessed fall. Hematoma on the left forehead. EXAM: CT HEAD WITHOUT CONTRAST CT CERVICAL SPINE WITHOUT CONTRAST TECHNIQUE: Multidetector CT imaging of the head and cervical spine was performed following the standard protocol without intravenous contrast. Multiplanar CT image reconstructions of the cervical spine were also generated. COMPARISON:  12/16/2016 CT head. 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 Small chronic infarction in left lateral frontal lobe. No evidence for large territory infarct, acute intracranial hemorrhage, mass effect, or hydrocephalus. Vascular: Extensive calcific atherosclerosis of carotid siphons and vertebrobasilar system. Skull: The left frontal scalp contusion. No displaced calvarial fracture. Sinuses/Orbits: No acute finding. Other: Left suboccipital subcutaneous stable dermal cyst. CT CERVICAL SPINE FINDINGS Alignment: Straightening of cervical lordosis.  No  listhesis. Skull base and  vertebrae: No acute fracture. No primary bone lesion or focal pathologic process. Soft tissues and spinal canal: No prevertebral fluid or swelling. No visible canal hematoma. Disc levels: Cervical spondylosis with discogenic degenerative changes greatest at the C5 through C7 levels or uncovertebral and facet hypertrophy contribute to mild to moderate bony foraminal narrowing. No high-grade canal stenosis. Upper chest: Negative. Other: Right greater than left carotid bifurcation calcifications. IMPRESSION: 1. Left frontal scalp hematoma.  No displaced calvarial fracture. 2. No acute intracranial abnormality identified. 3. Multiple stable chronic infarctions as above. 4. No acute fracture or dislocation of cervical spine. 5. Cervical spondylosis greatest at C5 through C7 levels. No high-grade canal stenosis. Electronically Signed   By: Kristine Garbe M.D.   On: 03/25/2017 14:46   Ct Cervical Spine Wo Contrast  Result Date: 03/25/2017 CLINICAL DATA:  66 y/o M; unwitnessed fall. Hematoma on the left forehead. EXAM: CT HEAD WITHOUT CONTRAST CT CERVICAL SPINE WITHOUT CONTRAST TECHNIQUE: Multidetector CT imaging of the head and cervical spine was performed following the standard protocol without intravenous contrast. Multiplanar CT image reconstructions of the cervical spine were also generated. COMPARISON:  12/16/2016 CT head. 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 Small chronic infarction in left lateral frontal lobe. No evidence for large territory infarct, acute intracranial hemorrhage, mass effect, or hydrocephalus. Vascular: Extensive calcific atherosclerosis of carotid siphons and vertebrobasilar system. Skull: The left frontal scalp contusion. No displaced calvarial fracture. Sinuses/Orbits: No acute finding. Other: Left suboccipital subcutaneous stable dermal cyst. CT CERVICAL SPINE  FINDINGS Alignment: Straightening of cervical lordosis.  No listhesis. Skull base and vertebrae: No acute fracture. No primary bone lesion or focal pathologic process. Soft tissues and spinal canal: No prevertebral fluid or swelling. No visible canal hematoma. Disc levels: Cervical spondylosis with discogenic degenerative changes greatest at the C5 through C7 levels or uncovertebral and facet hypertrophy contribute to mild to moderate bony foraminal narrowing. No high-grade canal stenosis. Upper chest: Negative. Other: Right greater than left carotid bifurcation calcifications. IMPRESSION: 1. Left frontal scalp hematoma.  No displaced calvarial fracture. 2. No acute intracranial abnormality identified. 3. Multiple stable chronic infarctions as above. 4. No acute fracture or dislocation of cervical spine. 5. Cervical spondylosis greatest at C5 through C7 levels. No high-grade canal stenosis. Electronically Signed   By: Kristine Garbe M.D.   On: 03/25/2017 14:46   Dg Chest Port 1 View  Result Date: 03/25/2017 CLINICAL DATA:  Altered mental status with unwitnessed fall EXAM: PORTABLE CHEST 1 VIEW COMPARISON:  12/16/2016 FINDINGS: No acute pulmonary infiltrate, pleural effusion, or pneumothorax. Stable cardiomediastinal silhouette with atherosclerosis. Old appearing left fifth rib deformity. IMPRESSION: No active disease. Electronically Signed   By: Donavan Foil M.D.   On: 03/25/2017 15:51     ASSESSMENT AND PLAN:   * Acute encephalopathy with UTI Started IV ceftriaxone. U cx pending. Still confused.  CT head- Nothing acute He does have advanced dementia at baseline but tends to follow commands per notes  * Dysphagia with possible aspiration Will need modified barium swallow. This could be due to worsening dementia. But due to acute change will get an MRI of the brain to rule out CVA. If there is no improvement he might need a PEG tube.  * Hypertension. Continue home medications.  *  CKD stage III is stable  * DVT prophylaxis with Lovenox  All the records are reviewed  and case discussed with Care Management/Social Workerr. Management plans discussed with the patient, family and they are in agreement.  CODE STATUS: FULL CODE  DVT Prophylaxis: SCDs  TOTAL TIME TAKING CARE OF THIS PATIENT: 30 minutes.   POSSIBLE D/C IN 1-2 DAYS, DEPENDING ON CLINICAL CONDITION.  Hillary Bow R M.D on 03/27/2017 at 11:43 AM  Between 7am to 6pm - Pager - [phone]  After 6pm go to [email or web address] - password [email or web address] [facility]  Office  [phone]  CC: Primary care physician; Alvester Morin, MD  Note: This dictation was prepared with Dragon dictation along with smaller phrase technology. Any transcriptional errors that result from this process are unintentional.

## 2017-03-28 ENCOUNTER — Inpatient Hospital Stay: Payer: Medicare Other

## 2017-03-28 LAB — BASIC METABOLIC PANEL WITH GFR
Anion gap: 4 — ABNORMAL LOW (ref 5–15)
BUN: 16 mg/dL (ref 6–20)
CO2: 26 mmol/L (ref 22–32)
Calcium: 8.3 mg/dL — ABNORMAL LOW (ref 8.9–10.3)
Chloride: 111 mmol/L (ref 101–111)
Creatinine, Ser: 1.16 mg/dL (ref 0.61–1.24)
GFR calc Af Amer: >60 mL/min
GFR calc non Af Amer: >60 mL/min
Glucose, Bld: 130 mg/dL — ABNORMAL HIGH (ref 65–99)
Potassium: 3.7 mmol/L (ref 3.5–5.1)
Sodium: 141 mmol/L (ref 135–145)

## 2017-03-28 LAB — GLUCOSE, CAPILLARY
Glucose-Capillary: 103 mg/dL — ABNORMAL HIGH (ref 65–99)
Glucose-Capillary: 107 mg/dL — ABNORMAL HIGH (ref 65–99)
Glucose-Capillary: 123 mg/dL — ABNORMAL HIGH (ref 65–99)
Glucose-Capillary: 152 mg/dL — ABNORMAL HIGH (ref 65–99)
Glucose-Capillary: 103 mg/dL — ABNORMAL HIGH (ref 65–99)
Glucose-Capillary: 159 mg/dL — ABNORMAL HIGH (ref 65–99)

## 2017-03-28 MED ORDER — METOPROLOL TARTRATE 25 MG PO TABS
12.5000 mg | ORAL_TABLET | Freq: Two times a day (BID) | ORAL | Status: DC
  Administered 2017-03-29 – 2017-03-30 (×3): 12.5 mg via ORAL
  Filled 2017-03-28: qty 1
  Filled 2017-03-28: qty 2
  Filled 2017-03-28: qty 1

## 2017-03-28 NOTE — Care Management (Signed)
Patient appears to be more appropriate for long-term care instead of LTAC. I have cancelled LTAC screen at both [facility]. I have notified [facility] that patient has been admitted.

## 2017-03-28 NOTE — Progress Notes (Signed)
Patient is A&Ox1. From [facility]. Barium swallow completed. MRI to be done, awaiting to speak with family for consent at this time. Cardiac monitoring at this time running SB-Afib, Aflutter. MD aware, parameters on sticky note, call attending <30HR. Manual radial pulse at 56. Scatter bruising. Patient seen by [facility].

## 2017-03-28 NOTE — Progress Notes (Signed)
[place] at [place] NAME: Joel Davis    MR#:  [id number]  DATE OF BIRTH:  1951/09/17  SUBJECTIVE:  CHIEF COMPLAINT:   Chief Complaint  Patient presents with  . Fall   Still confused.  Had modified barium swallow earlier today.  REVIEW OF SYSTEMS:    Review of Systems  Unable to perform ROS: Dementia    DRUG ALLERGIES:   Allergies  Allergen Reactions  . Penicillins     VITALS:  Blood pressure (!) 162/57, pulse 65, temperature 98.9 F (37.2 C), temperature source Oral, resp. rate 20, height 5\' 8"  (1.727 m), weight 78.9 kg (174 lb), SpO2 97 %.  PHYSICAL EXAMINATION:   Physical Exam  GENERAL:  66 y.o.-year-old patient lying in the bed with no acute distress.  EYES: Pupils equal, round, reactive to light and accommodation. No scleral icterus. Extraocular muscles intact.  HEENT: Head atraumatic, normocephalic. Oropharynx and nasopharynx clear.  NECK:  Supple, no jugular venous distention. No thyroid enlargement, no tenderness.  LUNGS: Coarse breath sounds CARDIOVASCULAR: S1, S2 normal. No murmurs, rubs, or gallops.  ABDOMEN: Soft, nontender, nondistended. Bowel sounds present. No organomegaly or mass.  EXTREMITIES: No cyanosis, clubbing or edema b/l.    NEUROLOGIC: Moves all 4 extremities but doesn't follow commands Consistently PSYCHIATRIC: The patient is alert and awake. Non verbal. SKIN: No obvious rash, lesion, or ulcer.   LABORATORY PANEL:   CBC  Recent Labs Lab 03/26/17 0423  WBC 7.8  HGB 11.5*  HCT 34.6*  PLT 228   ------------------------------------------------------------------------------------------------------------------ Chemistries   Recent Labs Lab 03/25/17 1229  03/28/17 0718  NA 139  < > 141  K 4.1  < > 3.7  CL 105  < > 111  CO2 26  < > 26  GLUCOSE 139*  < > 130*  BUN 23*  < > 16  CREATININE 1.37*  < > 1.16  CALCIUM 9.1  < > 8.3*  MG 1.9  --   --   AST 25  --   --   ALT 15*  --    --   ALKPHOS 81  --   --   BILITOT 0.5  --   --   < > = values in this interval not displayed. ------------------------------------------------------------------------------------------------------------------  Cardiac Enzymes No results for input(s): TROPONINI in the last 168 hours. ------------------------------------------------------------------------------------------------------------------  RADIOLOGY:  No results found.   ASSESSMENT AND PLAN:   * Acute encephalopathy with UTI Started IV ceftriaxone. U cx still pending. Still confused Which is his baseline CT head- Nothing acute He does have advanced dementia at baseline but tends to follow commands per nursing home.  * Dysphagia with possible aspiration No pneumonia on chest x-ray Modified barium swallow with aspiration even with honey thickened liquids. Discussed with speech therapist Barnetta Chapel. Will need PEG tube to prevent any dehydration the future. Check MRI of the brain to rule out CVA  * Hypertension. Continue home medications.  * CKD stage III is stable  * DVT prophylaxis with Lovenox  Discussed with palliative care team [facility] and. Patient does not have a designated healthcare part of attorney although sister makes most of his decisions.  Overall poor prognosis even if he gets a PEG tube. Would not recommend this  All the records are reviewed and case discussed with Care Management/Social Workerr. Management plans discussed with the patient, family and they are in agreement.  CODE STATUS: FULL CODE  DVT Prophylaxis: SCDs  TOTAL TIME TAKING CARE  OF THIS PATIENT: 30 minutes.   POSSIBLE D/C IN 1-2 DAYS, DEPENDING ON CLINICAL CONDITION.  Hillary Bow R M.D on 03/28/2017 at 10:39 AM  Between 7am to 6pm - Pager - [phone]  After 6pm go to [email or web address] - password [email or web address] [place] [facility]  Office  [phone]  CC: Primary care physician; Alvester Morin, MD  Note: This  dictation was prepared with Dragon dictation along with smaller phrase technology. Any transcriptional errors that result from this process are unintentional.

## 2017-03-28 NOTE — Progress Notes (Signed)
EKG performed, MD reviewed, dc'd lopressor.

## 2017-03-28 NOTE — Care Management Note (Signed)
Case Management Note  Patient Details  Name: Joel Davis MRN: [id number] Date of Birth: September 07, 1951  Subjective/Objective:                  PT recommendation for Long-term acute care hospital. Patient has had two admissions over last 6 months. Patient is from [facility] under long-term care. Not requiring sustaining IV fluids or supplemental Oxygen. Patient appears to need long-term skilled nursing care as LTAC is acute.   Action/Plan:   RNCM has asked [facility] to screen patient for LTAC criteria. RNCM will continue to follow.   Expected Discharge Date:  03/22/17               Expected Discharge Plan:     In-House Referral:     Discharge planning Services  CM Consult  Post Acute Care Choice:  Long Term Acute Care (LTAC) Choice offered to:  Patient  DME Arranged:    DME Agency:     HH Arranged:    [place] Agency:     Status of Service:  In process, will continue to follow  If discussed at Long Length of Stay Meetings, dates discussed:    Additional Comments:  Marshell Garfinkel, RN 03/28/2017, 8:23 AM

## 2017-03-28 NOTE — Progress Notes (Signed)
Clinical Education officer, museum (CSW) contacted patient's sister Suann Larry. Per sister patient is a long term care resident at [facility] under a [place]. 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 Sister is agreeable for patient to return to [facility] when stable for D/C. CSW will continue to follow and assist as needed.   [employer], LCSW [phone]

## 2017-03-28 NOTE — Progress Notes (Signed)
Called MD for parameters of HR, periodically drops to 30s. New orders for EKG for confirmation of any changes.

## 2017-03-28 NOTE — Evaluation (Signed)
Objective Swallowing Evaluation: Type of Study: MBS-Modified Barium Swallow Study  Patient Details  Name: Joel Davis MRN: [id number] Date of Birth: August 31, 1951  Today's Date: 03/28/2017 Time: SLP Start Time (ACUTE ONLY): 0830-SLP Stop Time (ACUTE ONLY): 0930 SLP Time Calculation (min) (ACUTE ONLY): 60 min  Past Medical History:  Past Medical History:  Diagnosis Date  . Anxiety   . Atrial flutter ([place])   . Basal cell carcinoma   . Cognitive communication deficit   . Coronary artery disease   . Dementia   . DM (diabetes mellitus) ([place])   . Hypertension   . Insomnia    Past Surgical History: History reviewed. No pertinent surgical history. HPI: Pt is a 66 y.o. male with a known history of hypertension, diabetes, Dementia w/ cognitive communication deficits from local nursing home due to worsening mental status. At baseline patient follows commands and is able to utter one or 2 words. But he has been found to be increasingly lethargic. Initially when he arrived in the emergency room he was completely nonverbal. Some improvement occurred. He does have a UTI with severe acute encephalopathy over his dementia and is being admitted to the hospitalist service. Pt does have Dysphagia baseline per family report and is on a dysphagia diet level 1(puree) w/ HONEY consistency liquids. Family also reports episodes of coughing w/ this diet and stated pt was scheduled to have a MBSS at the [place] next month(?). Pt requires full support w/ any oral intake and often exhibits oral phase deficits - suspect related to declined Cognitive status. Pt is currently on a Dysphagia 1 w/ Pudding consistency liquids post BSE on 03/26/17 w/ this SLP; intermittent oral intake noted per chart notes.   Subjective: pt sitting in chair; contracted and difficulty maintaining an upright position; nonverbal but nodded some to few questions. Noted congested respirations at rest.    Assessment / Plan / Recommendation  CHL IP  CLINICAL IMPRESSIONS 03/28/2017  Clinical Impression Pt presents w/ moderate-severe oropharyngeal phase dysphagia w/ moderate-severe risk for aspiration both during and after the swallow. Pt appears to most adequately tolerate puree consistency boluses and has been tolerating pudding consistency liquids w/ current oral diet on floor per NSG report. Due to pt's declined oropharyngeal swallow function and need for a restricted, modified diet consistency, pt is at increased risk for ability to meet hydration and nutrition needs, especially hydration needs. Would strongly recommend Dietician f/u; Palliative Care f/u. Suspect pt's oropharyngeal phase dysphagia is impacted by his baseline, progressive Dementia w/ decline in swallow function of his baseline dysphagia.   SLP Visit Diagnosis Dysphagia, oropharyngeal phase (R13.12)  Attention and concentration deficit following --  Frontal lobe and executive function deficit following --  Impact on safety and function Moderate aspiration risk;Severe aspiration risk      CHL IP TREATMENT RECOMMENDATION 03/28/2017  Treatment Recommendations Patient unable to participate in swallow therapy at this time     Prognosis 03/28/2017  Prognosis for Safe Diet Advancement Guarded  Barriers to Reach Goals Cognitive deficits;Severity of deficits  Barriers/Prognosis Comment --    CHL IP DIET RECOMMENDATION 03/28/2017  SLP Diet Recommendations Dysphagia 1 (Puree) solids;Pudding thick liquid  Liquid Administration via Spoon;No straw  Medication Administration Crushed with puree  Compensations Minimize environmental distractions;Slow rate;Small sips/bites;Lingual sweep for clearance of pocketing;Multiple dry swallows after each bite/sip;Follow solids with liquid  Postural Changes Remain semi-upright after after feeds/meals (Comment);Seated upright at 90 degrees      CHL IP OTHER RECOMMENDATIONS 03/28/2017  Recommended Consults (No  Data)  Oral Care Recommendations Oral  care BID;Staff/trained caregiver to provide oral care  Other Recommendations Order thickener from pharmacy;Prohibited food (jello, ice cream, thin soups);Remove water pitcher;Have oral suction available      CHL IP FOLLOW UP RECOMMENDATIONS 03/28/2017  Follow up Recommendations Skilled Nursing facility      [facility] IP FREQUENCY AND DURATION 03/28/2017  Speech Therapy Frequency (ACUTE ONLY) min 2x/week  Treatment Duration 1 week           CHL IP ORAL PHASE 03/28/2017  Oral Phase Impaired  Oral - Pudding Teaspoon --  Oral - Pudding Cup --  Oral - Honey Teaspoon --  Oral - Honey Cup --  Oral - Nectar Teaspoon --  Oral - Nectar Cup --  Oral - Nectar Straw --  Oral - Thin Teaspoon --  Oral - Thin Cup --  Oral - Thin Straw --  Oral - Puree --  Oral - Mech Soft --  Oral - Regular --  Oral - Multi-Consistency --  Oral - Pill --  Oral Phase - Comment c/b decreased labial closure and clearing from the utensil; slow bolus manipulation and A-P transfer; min oral phase residue post initial swallow w/ slow lingual movements overall for f/u swallowing/clearing. Premature spillage w/ the Honey consistency liquids low into the pharynx.     CHL IP PHARYNGEAL PHASE 03/28/2017  Pharyngeal Phase Impaired  Pharyngeal- Pudding Teaspoon --  Pharyngeal --  Pharyngeal- Pudding Cup --  Pharyngeal --  Pharyngeal- Honey Teaspoon --  Pharyngeal --  Pharyngeal- Honey Cup --  Pharyngeal --  Pharyngeal- Nectar Teaspoon --  Pharyngeal --  Pharyngeal- Nectar Cup --  Pharyngeal --  Pharyngeal- Nectar Straw --  Pharyngeal --  Pharyngeal- Thin Teaspoon --  Pharyngeal --  Pharyngeal- Thin Cup --  Pharyngeal --  Pharyngeal- Thin Straw --  Pharyngeal --  Pharyngeal- Puree --  Pharyngeal --  Pharyngeal- Mechanical Soft --  Pharyngeal --  Pharyngeal- Regular --  Pharyngeal --  Pharyngeal- Multi-consistency --  Pharyngeal --  Pharyngeal- Pill --  Pharyngeal --  Pharyngeal Comment c/b delayed pharyngeal  swallow initiation for swallow w/ all trial consistencies(Honey liquids; puree) w/ aspiration of Honey consistency liquid(via TSP) BEFORE the swallow; pt exhibited (min) delayed congested coughing; weakened pharyngeal pressure and laryngeal excursion during the swallow resulting in min bolus residue noted diffusely throughout the pharynx - any pharyngeal residue remaining increases the risk for aspiration of such b/t swallowing.     CHL IP CERVICAL ESOPHAGEAL PHASE 03/28/2017  Cervical Esophageal Phase WFL  Pudding Teaspoon --  Pudding Cup --  Honey Teaspoon --  Honey Cup --  Nectar Teaspoon --  Nectar Cup --  Nectar Straw --  Thin Teaspoon --  Thin Cup --  Thin Straw --  Puree --  Mechanical Soft --  Regular --  Multi-consistency --  Pill --  Cervical Esophageal Comment --       Orinda Kenner, MS, CCC-SLP Watson,Katherine 03/28/2017, 11:21 AM

## 2017-03-28 NOTE — Progress Notes (Signed)
Paged MD (Sudini) r/t HR, ok to hold metop for 6pm dose, 12.5mg  starting 5/29 BID.

## 2017-03-29 ENCOUNTER — Inpatient Hospital Stay: Payer: Medicare Other

## 2017-03-29 DIAGNOSIS — F0390 Unspecified dementia without behavioral disturbance: Secondary | ICD-10-CM

## 2017-03-29 DIAGNOSIS — R1310 Dysphagia, unspecified: Secondary | ICD-10-CM

## 2017-03-29 DIAGNOSIS — Z515 Encounter for palliative care: Secondary | ICD-10-CM

## 2017-03-29 DIAGNOSIS — Z7189 Other specified counseling: Secondary | ICD-10-CM

## 2017-03-29 LAB — GLUCOSE, CAPILLARY
GLUCOSE-CAPILLARY: 102 mg/dL — AB (ref 65–99)
GLUCOSE-CAPILLARY: 106 mg/dL — AB (ref 65–99)
GLUCOSE-CAPILLARY: 109 mg/dL — AB (ref 65–99)
GLUCOSE-CAPILLARY: 115 mg/dL — AB (ref 65–99)
GLUCOSE-CAPILLARY: 131 mg/dL — AB (ref 65–99)
GLUCOSE-CAPILLARY: 99 mg/dL (ref 65–99)
Glucose-Capillary: 181 mg/dL — ABNORMAL HIGH (ref 65–99)

## 2017-03-29 LAB — URINE CULTURE: Culture: >=100000 — AB

## 2017-03-29 MED ORDER — GLYCOPYRROLATE 0.2 MG/ML IJ SOLN
0.2000 mg | INTRAMUSCULAR | Status: DC | PRN
  Filled 2017-03-29: qty 1

## 2017-03-29 NOTE — Progress Notes (Signed)
Physical Therapy Treatment Patient Details Name: Joel Davis MRN: [id number] DOB: 11/12/1950 Today's Date: 03/29/2017    History of Present Illness Patient is a 66 y.o. male admitted on 25 May after experiencing worsening mental status and fall, injuring soft tissue of L elbow. Patient lives at [facility] in [facility]. PMH includes dementia, HTN, DM, and CKD III.    PT Comments    Pt back from testing.  Max a to reposition for comfort in bed.  Participated in exercises as described below.  Pt with poor engagement and inconsistant participation with exercises.   Follow Up Recommendations  SNF     Equipment Recommendations       Recommendations for Other Services       Precautions / Restrictions Precautions Precautions: Fall Restrictions Weight Bearing Restrictions: No Other Position/Activity Restrictions: Non-ambulatory    Mobility  Bed Mobility Overal bed mobility: Needs Assistance             General bed mobility comments: dependant for repositioning  Transfers                    Ambulation/Gait                 Stairs            Wheelchair Mobility    Modified Rankin (Stroke Patients Only)       Balance                                            Cognition Arousal/Alertness: Awake/alert Behavior During Therapy: WFL for tasks assessed/performed Overall Cognitive Status: History of cognitive impairments - at baseline                                        Exercises Other Exercises Other Exercises: BLE x 10 AAROM/PROM for ankle pumps, heel slides, SLR, AB/ADD    General Comments        Pertinent Vitals/Pain Pain Assessment: No/denies pain    Home Living                      Prior Function            PT Goals (current goals can now be found in the care plan section)      Frequency    Min 2X/week      PT Plan Current plan remains appropriate     Co-evaluation              AM-PAC PT "6 Clicks" Daily Activity  Outcome Measure  Difficulty turning over in bed (including adjusting bedclothes, sheets and blankets)?: A Lot Difficulty moving from lying on back to sitting on the side of the bed? : Total Difficulty sitting down on and standing up from a chair with arms (e.g., wheelchair, bedside commode, etc,.)?: Total Help needed moving to and from a bed to chair (including a wheelchair)?: Total Help needed walking in hospital room?: Total Help needed climbing 3-5 steps with a railing? : Total 6 Click Score: 7    End of Session   Activity Tolerance: Patient limited by fatigue Patient left: in bed;with call bell/phone within reach;with bed alarm set         Time: 5003-7048 PT Time Calculation (  min) (ACUTE ONLY): 9 min  Charges:  $Therapeutic Exercise: 8-22 mins                    G Codes:      Chesley Noon, PTA 03/29/17, 12:08 PM

## 2017-03-29 NOTE — Progress Notes (Signed)
Nutrition Follow-up  DOCUMENTATION CODES:   Non-severe (moderate) malnutrition in context of chronic illness  INTERVENTION:  Continue Magic cup TID with meals, each supplement provides 290 kcal and 9 grams of protein.   Will monitor discussions regarding goals of care and wishes regarding PEG tube. At this time patient only meeting approximately 65% of minimum estimated calorie and protein needs. Will also be very hard to meet hydration needs on modified diet with pudding thick liquids. Can consider [employer] Protocol to help with hydration if deemed appropriate by SLP and MD.  NUTRITION DIAGNOSIS:   Malnutrition (Moderate) related to chronic illness (Vascular dementia) as evidenced by moderate depletion of body fat, moderate depletions of muscle mass.  Ongoing.  GOAL:   Patient will meet greater than or equal to 90% of their needs  Progressing.  MONITOR:   PO intake, Supplement acceptance, Labs, I & O's, Weight trends  REASON FOR ASSESSMENT:   Malnutrition Screening Tool    ASSESSMENT:   Joel Davis  is a 66 y.o. male with a known history of Hypertension, diabetes, dementia syndrome from local nursing home due to worsening mental status  -Patient s/p modified barium swallow on 5/28. Showed aspiration even with honey-thickened liquids. Remains on dysphagia 1 diet with pudding-thick liquids. -MRI today showing no acute intracranial abnormality. 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 -PMT has been consulted to discuss goals of care.  No family at bedside at time of assessment. RN and NT in room moving patient up so he can continue eating breakfast. They report patient is able to eat fairly well. Unsure how he did with breakfast yesterday, but report he ate about 50% of lunch and about 75% of dinner yesterday. This differs from meal completion charted as that is reporting patient only having bites. Per chart patient had 0% of breakfast yesterday. He  enjoys the desserts (pudding, Magic Cup) and is also having the pudding-thick liquids. Per chart he had 1 cup thickened soda, one Magic Cup, and two cups thickened cranberry juice yesterday. Patient able to communicate by nodding head. Denies any abdominal pain or nausea. No documented bowel movement since admission.  In the past 24 hours patient has had approximately 1107 kcal (65% minimum estimated kcal needs) and 52 grams of protein (65% minimum estimated protein needs).  Medications reviewed and include: Novolog 0-9 units Q4hrs, senna, NS @ 50 ml/hr, ceftriaxone, Miralax PRN.  Labs reviewed: CBG 99-181 past 24 hrs. Sodium 141 on 5/28.  Weight trend: 79.4 kg (+1.2 kg from initial assessment - may be related to fluid status)  Also discussed patient with SLP. He will have to remain on pudding-thick liquids due to level of aspiration risk. PEG tube has been discussed in setting of maintaining hydration status. Per chart MD not recommending as it will not enhance quality of life. Will continue to monitor discussions.  Diet Order:  DIET - DYS 1 Room service appropriate? Yes with Assist; Fluid consistency: Pudding Thick  Skin:  Reviewed, no issues  Last BM:  Unknown  Height:   Ht Readings from Last 1 Encounters:  03/25/17 5\' 8"  (1.727 m)    Weight:   Wt Readings from Last 1 Encounters:  03/29/17 175 lb (79.4 kg)    Ideal Body Weight:  70 kg  BMI:  Body mass index is 26.61 kg/m.  Estimated Nutritional Needs:   Kcal:  1700-2000 (MSJ x 1.1-1.3)  Protein:  80-95 grams (1-1.2 grams/kg)  Fluid:  1.7-2 L/day  EDUCATION NEEDS:  No education needs identified at this time  Willey Blade, MS, RD, LDN Pager: [phone] After Hours Pager: [phone]

## 2017-03-29 NOTE — Consult Note (Addendum)
Consultation Note Date: 03/29/2017   Patient Name: Joel Davis  DOB: 09/11/1951  MRN: [id number]  Age / Sex: 66 y.o., male  PCP: Joel Morin, MD Referring Physician: Loletha Grayer, MD  Reason for Consultation: Establishing goals of care  HPI/Patient Profile: 66 y.o. male  with past medical history of hypertension, diabetes, HTN, CAD, dementia, dysphagia, insomnia admitted on 03/25/2017 with worsening mental status and unwitnessed fall. Has been found to have UTI, MRI negative for stroke, and with severe aspiration and poor intake. Prognosis poor.   Clinical Assessment and Goals of Care: Joel Davis is alert but nonverbal but clearly answering my yes/no questions with head nods. He denies discomfort/pain. Endorses tiredness. I turned the television on for him. Explained that he has trouble swallowing. He has a wet/congested cough and unable to clear secretions.   I spoke more with his sister, Joel Davis, who had just called the nursing unit. Joel Davis has good understanding of her brother's poor prognosis with severe aspiration risk and poor intake. Understands that he is unlikely to be able to maintain nutrition and certainly hydration. He had only had a few bites from his lunch tray and does not endorse hunger. Joel Davis also tells me that Joel Davis discussed hospice with them. Joel Davis says this shocked her but if he is not going to improve her goal and hope for him is to be comfortable and not suffer. She realizes the need for hospice and agrees with this hospice care for him. She tells me that he was placed at [facility] by the [place] but that all family live in [place], [place] and she is hopeful that he could be placed in hospice closer to family so that they may visit with him.   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 He loves football, fishing, sitting outside, car racing, and ice cream. His wife died ~5 yrs ago and he has been in the care of [facility] ~3-4 years now. He has 3 sons living in [place] and Joel Davis has been in touch with them this morning.   I have left a message for son Joel Davis [phone]).   Primary Decision Maker NEXT OF KIN He has 3 sons but they are not local or available - will keep trying to reach. Sister, Joel Davis, has been Media planner historically.     SUMMARY OF RECOMMENDATIONS   Family hopeful for hospice facility closer to their home - CSW consulted  Code Status/Advance Care Planning:  DNR   Symptom Management:   Secretions: Robinul 0.2 every 4 hours prn.   Pain/SOB: Roxanol 5 mg every 2 hours prn.   Palliative Prophylaxis:   Aspiration, Bowel Regimen, Delirium Protocol, Oral Care and Turn Reposition   Psycho-social/Spiritual:   Desire for further Chaplaincy support:no  Additional Recommendations: Caregiving  Support/Resources, Education on Hospice and Grief/Bereavement Support  Prognosis:   < 2 weeks is likely considering  aspiration risk and poor intake (dehydration expected once IVF stopped).   Discharge Planning: Hospice facility vs [facility] with hospice     Primary Diagnoses: Present on Admission: . UTI (urinary tract infection)   I have reviewed the medical record, interviewed the patient and family, and examined the patient. The following aspects are pertinent.  Past Medical History:  Diagnosis Date  . Anxiety   . Atrial flutter ([place])   . Basal cell carcinoma   . Cognitive communication deficit   . Coronary artery disease   . Dementia   . DM (diabetes mellitus) ([place])   . Hypertension   . Insomnia    Social History   Social History  . Marital status: Single    Spouse name: N/A  . Number of children: N/A  . Years of education: N/A   Social History Main Topics  . Smoking status: Former Research scientist (life sciences)  . Smokeless tobacco:  Former Systems developer  . Alcohol use No  . Drug use: Unknown  . Sexual activity: Not Asked   Other Topics Concern  . None   Social History Narrative   From nursing home   Family History  Problem Relation Age of Onset  . Family history unknown: Yes   Scheduled Meds: . aspirin EC  81 mg Oral Daily  . atorvastatin  80 mg Oral Daily  . chlorhexidine  15 mL Mouth/Throat Daily  . citalopram  20 mg Oral Daily  . enoxaparin (LOVENOX) injection  40 mg Subcutaneous Q24H  . insulin aspart  0-9 Units Subcutaneous Q4H  . metoprolol tartrate  12.5 mg Oral BID  . senna-docusate  1 tablet Oral Daily   Continuous Infusions: . sodium chloride 50 mL/hr (03/29/17 0630)  . cefTRIAXone (ROCEPHIN) 1 g IVPB Stopped (03/28/17 1828)   PRN Meds:.acetaminophen **OR** acetaminophen, albuterol, LORazepam, ondansetron **OR** ondansetron (ZOFRAN) IV, oxyCODONE, polyethylene glycol Allergies  Allergen Reactions  . Penicillins    Review of Systems  Unable to perform ROS: Patient nonverbal    Physical Exam  Constitutional: He appears well-developed and well-nourished.  HENT:  Head: Normocephalic and atraumatic.  Cardiovascular: Bradycardia present.   Pulmonary/Chest: No accessory muscle usage. No tachypnea. No respiratory distress. He has rhonchi.  Abdominal: Soft. Normal appearance. He exhibits no distension and no ascites.  Neurological: He is alert.  Follows commands and nods head yes/no appropriately to questions  Nursing note and vitals reviewed.   Vital Signs: BP (!) 127/44 (BP Location: Right Arm)   Pulse 68   Temp 97.3 F (36.3 C) (Axillary)   Resp 20   Ht 5\' 8"  (1.727 m)   Wt 79.4 kg (175 lb)   SpO2 96%   BMI 26.61 kg/m  Pain Assessment: PAINAD POSS *See Group Information*: 1-Acceptable,Awake and alert Pain Score: 0-No pain   SpO2: SpO2: 96 % O2 Device:SpO2: 96 % O2 Flow Rate: .O2 Flow Rate (L/min): 2 L/min  IO: Intake/output summary:  Intake/Output Summary (Last 24 hours) at  03/29/17 1337 Last data filed at 03/29/17 0557  Gross per 24 hour  Intake          1580.83 ml  Output                0 ml  Net          1580.83 ml    LBM: Last BM Date:  (Unknown) Baseline Weight: Weight: 74.8 kg (165 lb) Most recent weight: Weight: 79.4 kg (175 lb)     Palliative Assessment/Data: 20%   Flowsheet Rows  Most Recent Value  Intake Tab  Referral Department  Hospitalist  Unit at Time of Referral  Orthopedic Unit  Palliative Care Primary Diagnosis  Neurology  Date Notified  03/28/17  Palliative Care Type  New Palliative care  Reason for referral  Clarify Goals of Care  Date of Admission  03/25/17  # of days IP prior to Palliative referral  3  Clinical Assessment  Psychosocial & Spiritual Assessment  Palliative Care Outcomes      Time Total: 60min  Greater than 50%  of this time was spent counseling and coordinating care related to the above assessment and plan.  Signed by: Vinie Sill, NP Palliative Medicine Team Pager # [phone] (M-F 8a-5p) Team Phone # [phone] (Nights/Weekends)

## 2017-03-29 NOTE — Progress Notes (Signed)
PT Cancellation Note  Patient Details Name: Joel Davis MRN: [id number] DOB: Mar 25, 1951   Cancelled Treatment:    Reason Eval/Treat Not Completed: Patient at procedure or test/unavailable   Pt out of room, not available for session this am.   Chesley Noon 03/29/2017, 10:14 AM

## 2017-03-29 NOTE — Progress Notes (Signed)
Patient ID: Joel Davis, male   DOB: January 12, 1951, 66 y.o.   MRN: [id number]  ACP note  Patient unable to participate in this conversation. Conversation held with [place]  Patient failed swallow evaluation, even with dysphasia diet. The patient will unlikely be able to keep up with his nutritional needs. With a history of dementia and is not a candidate for PEG tube. I had a conversation about CODE STATUS and she agreed to DO NOT RESUSCITATE status. Patient was changed from full code over to a DO NOT RESUSCITATE. She is interested in comfort measures. She is agreeable to palliative care consultation and hospice. I advised that the patient could potentially go to the hospice home versus back to his facility with hospice with the goal of comfort in mind. The patient will continue to aspirate regardless of what we do. Overall prognosis is poor.  Time spent on ACP discussion 17 minutes  Dr. Loletha Grayer

## 2017-03-29 NOTE — Progress Notes (Signed)
Inpatient Diabetes Program Recommendations  AACE/ADA: New Consensus Statement on Inpatient Glycemic Control (2015)  Target Ranges:  Prepandial:   less than 140 mg/dL      Peak postprandial:   less than 180 mg/dL (1-2 hours)      Critically ill patients:  140 - 180 mg/dL   Lab Results  Component Value Date   GLUCAP 109 (H) 03/29/2017   HGBA1C 7.2 (H) 03/25/2017    Review of Glycemic Control  Results for SMARAN, GAUS (MRN [id number]) as of 03/29/2017 15:39  Ref. Range 03/28/2017 19:56 03/29/2017 01:40 03/29/2017 04:29 03/29/2017 07:21 03/29/2017 11:47  Glucose-Capillary Latest Ref Range: 65 - 99 mg/dL 159 (H) 106 (H) 99 181 (H) 109 (H)    Diabetes history: Type 2 Outpatient Diabetes medications: none Current orders for Inpatient glycemic control: Novolog 0-9 units q4h  Inpatient Diabetes Program Recommendations:   Agree with current medications for blood sugar management.    Gentry Fitz, RN, BA, MHA, CDE Diabetes Coordinator Inpatient Diabetes Program  [phone] (Team Pager) [phone] ([place]) 03/29/2017 3:40 PM

## 2017-03-29 NOTE — Care Management (Signed)
TC received from [place] at the [facility]. Updated on patient condition and potential discharge plan

## 2017-03-29 NOTE — Progress Notes (Signed)
Patient ID: Joel Davis, male   DOB: 08-26-1951, 66 y.o.   MRN: [id number]  [facility] PROGRESS NOTE  Panagiotis Oelkers XNA:[id number] DOB: 01-18-1951 DOA: 03/25/2017 PCP: Alvester Morin, MD  HPI/Subjective: Patient answers some yes or no questions. States he feels sick but cannot elaborate. As per nursing staff, his heart rate when in the 30s overnight  Objective: Vitals:   03/28/17 2255 03/29/17 0727  BP: (!) 145/46 (!) 127/44  Pulse: (!) 57 68  Resp: 19 20  Temp: 97.8 F (36.6 C) 97.3 F (36.3 C)    Filed Weights   03/27/17 0500 03/28/17 0510 03/29/17 0500  Weight: 76.6 kg (168 lb 12.8 oz) 78.9 kg (174 lb) 79.4 kg (175 lb)    ROS: Review of Systems  Unable to perform ROS: Dementia  Respiratory: Positive for cough. Negative for shortness of breath.   Cardiovascular: Negative for chest pain.  Gastrointestinal: Negative for abdominal pain.   Exam: Physical Exam  HENT:  Nose: No mucosal edema.  Mouth/Throat: No oropharyngeal exudate or posterior oropharyngeal edema.  Eyes: Conjunctivae, EOM and lids are normal. Pupils are equal, round, and reactive to light.  Neck: No JVD present. Carotid bruit is not present. No edema present. No thyroid mass and no thyromegaly present.  Cardiovascular: S1 normal and S2 normal.  Exam reveals no gallop.   No murmur heard. Pulses:      Dorsalis pedis pulses are 2+ on the right side, and 2+ on the left side.  Respiratory: No respiratory distress. He has no wheezes. He has no rhonchi. He has no rales.  GI: Soft. Bowel sounds are normal. There is no tenderness.  Musculoskeletal:       Right ankle: He exhibits no swelling.       Left ankle: He exhibits no swelling.  Lymphadenopathy:    He has no cervical adenopathy.  Neurological: He is alert.  Able to straight leg raise bilaterally  Skin: Skin is warm. No rash noted. Nails show no clubbing.  Psychiatric: He has a normal mood and affect.      Data Reviewed: Basic  Metabolic Panel:  Recent Labs Lab 03/25/17 1229 03/26/17 0423 03/28/17 0718  NA 139 143 141  K 4.1 3.9 3.7  CL 105 109 111  CO2 26 27 26   GLUCOSE 139* 107* 130*  BUN 23* 18 16  CREATININE 1.37* 1.27* 1.16  CALCIUM 9.1 8.6* 8.3*  MG 1.9  --   --    Liver Function Tests:  Recent Labs Lab 03/25/17 1229  AST 25  ALT 15*  ALKPHOS 81  BILITOT 0.5  PROT 7.2  ALBUMIN 3.5    Recent Labs Lab 03/25/17 1229  AMMONIA <9*   CBC:  Recent Labs Lab 03/25/17 1229 03/26/17 0423  WBC 9.1 7.8  HGB 12.3* 11.5*  HCT 37.3* 34.6*  MCV 94.2 94.4  PLT 268 228    CBG:  Recent Labs Lab 03/28/17 1956 03/29/17 0140 03/29/17 0429 03/29/17 0721 03/29/17 1147  GLUCAP 159* 106* 99 181* 109*    Recent Results (from the past 240 hour(s))  Urine culture     Status: Abnormal   Collection Time: 03/25/17  4:47 PM  Result Value Ref Range Status   Specimen Description URINE, RANDOM  Final   Special Requests NONE  Final   Culture >=100,000 COLONIES/mL ESCHERICHIA COLI (A)  Final   Report Status 03/29/2017 FINAL  Final   Organism ID, Bacteria ESCHERICHIA COLI (A)  Final      Susceptibility  Escherichia coli - MIC*    AMPICILLIN >=32 RESISTANT Resistant     CEFAZOLIN 16 SENSITIVE Sensitive     CEFTRIAXONE <=1 SENSITIVE Sensitive     CIPROFLOXACIN >=4 RESISTANT Resistant     GENTAMICIN <=1 SENSITIVE Sensitive     IMIPENEM <=0.25 SENSITIVE Sensitive     NITROFURANTOIN <=16 SENSITIVE Sensitive     TRIMETH/SULFA <=20 SENSITIVE Sensitive     AMPICILLIN/SULBACTAM >=32 RESISTANT Resistant     PIP/TAZO <=4 SENSITIVE Sensitive     Extended ESBL NEGATIVE Sensitive     * >=100,000 COLONIES/mL ESCHERICHIA COLI  MRSA PCR Screening     Status: None   Collection Time: 03/25/17  8:21 PM  Result Value Ref Range Status   MRSA by PCR NEGATIVE NEGATIVE Final    Comment:        The GeneXpert MRSA Assay (FDA approved for NASAL specimens only), is one component of a comprehensive MRSA  colonization surveillance program. It is not intended to diagnose MRSA infection nor to guide or monitor treatment for MRSA infections.      Studies: Dg Abd 1 View  Result Date: 03/29/2017 CLINICAL DATA:  Metal screening prior to MRI EXAM: ABDOMEN - 1 VIEW COMPARISON:  None. FINDINGS: Nonobstructive bowel gas pattern. Vascular calcifications. Degenerative change of the lumbar spine. No radiopaque foreign body is seen. IMPRESSION: No radiopaque foreign body is seen. Electronically Signed   By: Julian Hy M.D.   On: 03/29/2017 09:34   Mr Brain Wo Contrast  Result Date: 03/29/2017 CLINICAL DATA:  66 year old male with worsening mental status, increasingly lethargic. Newly nonverbal. EXAM: MRI HEAD WITHOUT CONTRAST TECHNIQUE: Multiplanar, multiecho pulse sequences of the brain and surrounding structures were obtained without intravenous contrast. COMPARISON:  Head and cervical spine CT 03/25/2017 and earlier. FINDINGS: Brain: Study is intermittently degraded by motion artifact despite repeated imaging attempts. Large area of chronic encephalomalacia in the right PCA territory (including right mesial temporal lobe involvement) with regional white matter gliosis and some dystrophic calcification. Ex vacuo enlargement of the posterior right lateral ventricle. There is also chronic encephalomalacia in the left frontal operculum tracking towards the superior peri-Rolandic cortex. This encephalomalacia was present on a February 2018 head CT. There is mildly heterogeneous diffusion signal here (series 100, image 32), but this appears to be related to laminar necrosis rather than acute ischemia. Regional white matter gliosis here also. Chronic lacunar infarcts in the medial thalami I and basal ganglia to a lesser degree. Patchy encephalomalacia in the left cerebellar peduncle. No convincing restricted diffusion to suggest acute infarction. No midline shift, mass effect, evidence of mass lesion,  ventriculomegaly, extra-axial collection or acute intracranial hemorrhage. Cervicomedullary junction and pituitary are within normal limits. Vascular: Major intracranial vascular flow voids are preserved. Skull and upper cervical spine: Negative. Sinuses/Orbits: Negative. Other: Visible internal auditory structures appear normal. Negative scalp soft tissues. IMPRESSION: 1. Intermittently degraded by motion. No convincing acute intracranial abnormality. 2. Chronic encephalomalacia in the middle left MCA territory, throughout the right PCA territory, and at the left middle cerebellar peduncle appears to be post ischemic in nature. Superimposed chronic lacunar infarcts in the deep gray matter nuclei. Electronically Signed   By: Genevie Ann M.D.   On: 03/29/2017 10:26    Scheduled Meds: . aspirin EC  81 mg Oral Daily  . atorvastatin  80 mg Oral Daily  . chlorhexidine  15 mL Mouth/Throat Daily  . citalopram  20 mg Oral Daily  . enoxaparin (LOVENOX) injection  40 mg Subcutaneous Q24H  .  insulin aspart  0-9 Units Subcutaneous Q4H  . metoprolol tartrate  12.5 mg Oral BID  . senna-docusate  1 tablet Oral Daily   Continuous Infusions: . sodium chloride 50 mL/hr (03/29/17 0630)  . cefTRIAXone (ROCEPHIN) 1 g IVPB Stopped (03/28/17 1828)    Assessment/Plan:  1.  Failure to thrive, dysphagia with aspiration. Patient failed swallow evaluation even with the dysphagia diet that he is on. Palliative care consultation. Overall prognosis is poor. Case discussed with the power of attorney and she agreed to DO NOT RESUSCITATE status. Patient will be a candidate for hospice. With dementia of the patient is not a candidate for a PEG tube. 2. Acute encephalopathy with urinary tract infection. MRI of the brain negative for acute stroke. Urine culture growing Escherichia coli sensitive to Rocephin. 3. Bradycardia overnight. Decrease metoprolol dose down to 12.5 mg twice a day. 4. Essential hypertension blood pressure on the  lower side. Stop lisinopril and decrease metoprolol dose 5. Chronic kidney disease stage II 6. Anxiety and depression on Celexa 7. Hyperlipidemia unspecified on atorvastatin 8. Dementia without behavioral disturbance 9. History of atrial flutter. Aspirin only for anticoagulation  Code Status:     Code Status Orders        Start     Ordered   03/29/17 1146  Do not attempt resuscitation (DNR)  Continuous    Question Answer Comment  In the event of cardiac or respiratory ARREST Do not call a "code blue"   In the event of cardiac or respiratory ARREST Do not perform Intubation, CPR, defibrillation or ACLS   In the event of cardiac or respiratory ARREST Use medication by any route, position, wound care, and other measures to relive pain and suffering. May use oxygen, suction and manual treatment of airway obstruction as needed for comfort.   Comments nurse may pronounce      03/29/17 1145    Code Status History    Date Active Date Inactive Code Status Order ID Comments User Context   03/25/2017  5:43 PM 03/29/2017 11:45 AM Full Code [id number]  Hillary Bow, MD ED   12/16/2016  6:08 PM 12/19/2016  6:51 PM Full Code [id number]  Gladstone Lighter, MD Inpatient     Family Communication: Spoke with Poa Disposition Plan: Potential hospice home versus back to facility with hospice  Consultants:  Palliative care  Antibiotics:  Rocephin  Time spent: 35 minutes including ACP time  Loletha Grayer  [employer]

## 2017-03-30 DIAGNOSIS — F0390 Unspecified dementia without behavioral disturbance: Secondary | ICD-10-CM

## 2017-03-30 DIAGNOSIS — R1310 Dysphagia, unspecified: Secondary | ICD-10-CM

## 2017-03-30 DIAGNOSIS — Z7189 Other specified counseling: Secondary | ICD-10-CM

## 2017-03-30 DIAGNOSIS — Z515 Encounter for palliative care: Secondary | ICD-10-CM

## 2017-03-30 LAB — GLUCOSE, CAPILLARY
GLUCOSE-CAPILLARY: 118 mg/dL — AB (ref 65–99)
GLUCOSE-CAPILLARY: 124 mg/dL — AB (ref 65–99)
Glucose-Capillary: 97 mg/dL (ref 65–99)

## 2017-03-30 MED ORDER — FLEET ENEMA 7-19 GM/118ML RE ENEM: 1.0000 | ENEMA | Freq: Every day | RECTAL | Status: DC | PRN

## 2017-03-30 MED ORDER — LISINOPRIL 20 MG PO TABS
40.0000 mg | ORAL_TABLET | Freq: Every day | ORAL | Status: DC
  Administered 2017-03-30: 40 mg via ORAL
  Filled 2017-03-30 (×2): qty 2

## 2017-03-30 MED ORDER — CEPHALEXIN 250 MG PO CAPS: 250.0000 mg | ORAL_CAPSULE | Freq: Three times a day (TID) | ORAL | 0 refills | Status: AC

## 2017-03-30 MED ORDER — METOPROLOL TARTRATE 25 MG PO TABS: 12.5000 mg | ORAL_TABLET | Freq: Two times a day (BID) | ORAL | 0 refills | Status: AC

## 2017-03-30 MED ORDER — BISACODYL 10 MG RE SUPP
10.0000 mg | Freq: Every day | RECTAL | Status: DC | PRN
  Administered 2017-03-30: 10 mg via RECTAL
  Filled 2017-03-30: qty 1

## 2017-03-30 MED ORDER — OXYCODONE HCL 5 MG PO TABS: 5.0000 mg | ORAL_TABLET | ORAL | 0 refills | Status: AC | PRN

## 2017-03-30 NOTE — Progress Notes (Signed)
Pt discharge to [facility] with hospice per MD order without incident via nonemergency transport. No change in pt from AM assessment. No s/sx resp distress or any other s/sx of distress on discharge. Prior to d/c, rept called to Associate Professor at [facility].

## 2017-03-30 NOTE — Care Management Important Message (Signed)
Important Message  Patient Details  Name: Joel Davis MRN: [id number] Date of Birth: 1951-05-20   Medicare Important Message Given:  Yes    Jolly Mango, RN 03/30/2017, 11:08 AM

## 2017-03-30 NOTE — Progress Notes (Signed)
Patient is medically stable for D/C back to [facility] today. Clinical Education officer, museum (CSW) has made several attempts to contact patient's sister Suann Larry to make her aware of D/C however she has not answered, and CSW left 3 detailed voicemails. On Monday 03/28/17 patient's sister [name] was agreeable for patient to return to [facility]. Per Engineer, agricultural the transfer to [facility] will move forward as MD and CSW have made several attempts to contact patient's sister.   Per Neoma Laming admissions coordinator at [facility] patient can return today and she will follow up on the hospice referral. Patient will go to room 307-B. RN will call report to [place] and arrange EMS for transport. CSW sent D/C orders to [facility] via [place]. Patient is aware of above. [place] staff member is aware of above. Please reconsult if future social work needs arise. CSW signing off.   [employer], LCSW [phone]

## 2017-03-30 NOTE — Progress Notes (Signed)
Clinical Education officer, museum (CSW) received consult to set up hospice services. CSW discussed case with palliative care NP who stated that family is requesting a hospice closer to [place], [place]. CSW contacted patient's sister Joel Davis to discuss options. Joel Davis is not going to be an option because per [place] with the contract nursing homes program at the [facility] there are no [facility] contract facilities in the [place] area. Per Joel Davis patient can return to [facility] under hospice care and the [place] will continue to pay for the SNF. Per Joel Davis patient's family can chose a hospice agency that contracts with Joel Davis to provide services there.   CSW is waiting on patient's sister to call back. CSW will continue to follow and assist as needed.   [employer], LCSW [phone]

## 2017-03-30 NOTE — Progress Notes (Signed)
Clinical Education officer, museum (CSW) attempted to contact patient's sister Suann Larry for a second time to discuss D/C plan however she did not answer and a second voicemail was left.   [employer], LCSW [phone]

## 2017-03-30 NOTE — Discharge Instructions (Signed)
Dysphagia Diet Level 1, Pureed The dysphasia level 1 diet includes foods that are completely pureed and smooth. The foods have a pudding-like texture, such as the texture of pureed pancakes, mashed potatoes, and yogurt. The diet does not include foods with lumps or coarse textures. Liquids should be smooth and may either be thin, nectar-thick, honey-like, or spoon-thick. This diet is helpful for people with moderate to severe swallowing problems. It reduces the risk of food getting caught in the windpipe, trachea, or lungs. You may need help or supervision during meals while following this diet. What do I need to know about this diet? Foods   You may eat foods that are soft and have a pudding-like texture. If a food does not have this texture, you may be able to eat the food after:  Pureeing it. This can be done with a blender or whisk.  Moistening it with liquid. For example, you may have bread if you soak it in milk or syrup.  Avoid foods that are hard, dry, sticky, chunky, lumpy, or stringy. Also avoid foods with nuts, seeds, raisins, skins, and pulp.  Do not eat foods that you have to chew. If you have to chew the food, then you cannot eat it.  Eat a variety of foods to get all the nutrients you need. Liquids   You may drink liquids that are smooth. Your health care provider will tell you if you should drink thin or thickened liquids.  To thicken a liquid, use a food and beverage thickener or a thickening food. Thickened liquids are usually a pudding-like consistency.  Thin liquids include fruit juices, milk, coffee, tea, yogurts, shakes, and similar foods that melt to thin liquid at room temperature.  Avoid liquids with seeds, pulp, or chunks. See your dietitian or health care provider regularly for help with your dietary changes. What foods can I eat? Grains  Store-bought soft breads, pancakes, and [place] toast that have a smooth, moist texture and do not have nuts or seeds  (you will need to moisten the food with liquid). Cooked cereals that have a pudding-like consistency, such as cream of wheat or farina (no oatmeal). Pureed, well-cooked pasta, rice, and plain bread stuffing. Vegetables  Pureed vegetables. Soft avocado. Smooth tomato paste or sauce. Strained or pureed soups (these may need to be thickened as directed). Mashed or pureed potatoes without skin (can be seasoned with butter, smooth gravy, margarine, or sour cream). Fruits  Pureed fruits such as melons and apples without seeds or pulp. Mashed bananas. Smooth tomato paste or sauce. Fruit juices without pulp or seeds. Strained or pureed soups. Meat and Other Protein Sources  Pureed meat. Smooth pate or liverwurst. Smooth souffles. Pureed beans (such as lentils). Pureed eggs. Dairy  Yogurt. Smooth cheese sauces. Milk (may need to be thickened). Nutritional dairy drinks or shakes. Ask your health care provider whether you can have ice cream. Condiments  Finely ground salt, pepper, and other ground spices. Sweets/Desserts  Smooth puddings and custards. Pureed desserts. Souffles. Whipped topping. Ask your health care provider whether you can have frozen desserts. Fats and Oils  Butter. Margarine. Smooth and strained gravy. Sour cream. Mayonnaise. Cream cheese. Whipped topping. Smooth sauces (such as white sauce, cheese sauce, or hollandaise sauce). The items listed above may not be a complete list of recommended foods or beverages. Contact your dietitian for more options.  What foods are not recommended? Grains  Oatmeal. Dry cereals. Hard breads. Vegetables  Whole vegetables. Stringy vegetables (such as celery).  Thin tomato sauce. Fruits  Whole fresh, frozen, canned, or dried fruits that have not been pureed. Stringy fruits (such as pineapple). Meat and Other Protein Sources  Whole or ground meat, fish, or poultry. Dried or cooked lentils or legumes that have been cooked but not mashed or pureed.  Non-pureed eggs. Nuts and seeds. Peanut butter. Dairy  Non-pureed cheese. Dairy products with lumps or chunks. Ask your health care provider whether you can have ice cream. Condiments  Coarse or seeded herbs and spices. Sweets/Desserts  [place] preserves. Jams with seeds. Solid desserts. Sticky, chewy sweets (such as licorice and caramel). Ask your health care provider whether you can have frozen desserts. Fats and Oils  Sauces of fats with lumps or chunks. The items listed above may not be a complete list of foods and beverages to avoid. Contact your dietitian for more information.  This information is not intended to replace advice given to you by your health care provider. Make sure you discuss any questions you have with your health care provider. Document Released: 10/18/2005 Document Revised: 03/25/2016 Document Reviewed: 10/01/2013 Elsevier Interactive Patient Education  2017 Elsevier Inc. Aspiration Precautions, Adult Aspiration is the breathing in (inhalation) of a liquid or object into the lungs. Things that can be inhaled into the lungs include:  Food.  Any type of liquid, such as drinks or saliva.  Stomach contents, such as vomit or stomach acid. What are the signs of aspiration? Signs of aspiration include:  Coughing after swallowing food or liquids.  Clearing the throat often while eating.  Trouble breathing. This may include:  Breathing quickly.  Breathing very slowly.  Loud breathing.  Rumbling sounds from the lungs while breathing.  Coughing up phlegm (sputum) that:  Is yellow, tan, or green.  Has pieces of food in it.  Is bad-smelling.  Having a hoarse, barky cough.  Not being able to speak.  A hoarse voice.  Drooling while eating.  A feeling of fullness in the throat or a feeling that something is stuck in the throat.  Choking often.  Having a runny noise while eating.  Coughing when lying down or having to sit up quickly after lying  down.  A change in skin color. The skin may look red or blue.  Fever.  Watery eyes.  Pain in the chest or back.  A pained look on the face. What are the complications of aspiration? Complications of aspiration include:  Losing weight because the person is not absorbing needed nutrients.  Loss of enjoyment and the social benefits of eating.  Choking.  Lung irritation, if someone aspirates acidic food or drinks.  Lung infection (pneumonia).  Collection of infected liquid (pus) in the lungs (lung abscess). In serious cases, death can occur. What can I do to prevent aspiration? Caring for someone who has a feeding tube  If you are caring for someone who has a feeding tube who cannot eat or drink safely through his or her mouth:  Keep the person in an upright position as much as possible.  Do not lay the person flat if he or she is getting continuous feedings. If you need to lay the person flat for any reason, turn the feeding pump off.  Check feeding tube residuals as told by your health care provider. Ask your health care provider what residual amount is too high. Caring for someone who can eat and drink safely by mouth  If you are caring for someone who can eat and drink safely through  his or her mouth:  Have the person sit in an upright position when eating food or drinking fluids. This can be done in two ways:  Have the person sit up in a chair.  If sitting in a chair is not possible, position the person in bed so he or she is upright.  Remind the person to eat slowly and chew well. Make sure the person is awake and alert while eating.  Do not distract the person. This is especially important for people with thinking or memory (cognitive) problems.  Allow foods to cool. Hot foods may be more difficult to swallow.  Provide small meals more frequently, instead of 3 large meals. This may reduce fatigue during eating.  Check the person's mouth thoroughly for leftover  food after eating.  Keep the person sitting upright for 30-45 minutes after eating.  Do not serve food or drink during 2 hours or more before bedtime. General instructions  Follow these general guidelines to prevent aspiration in someone who can eat and drink safely by mouth:  Never put food or liquids in the mouth of a person who is not fully alert.  Feed small amounts of food. Do not force feed.  For a person who is on a diet for swallowing difficulty (dysphagia diet), follow the recommended food and drink consistency. For example, in dysphagia diet level 1, thicken liquids to pudding-like consistency.  Use as little water as possible when brushing the person's teeth or cleaning his or her mouth.  Provide oral care before and after meals.  Use adaptive devices such as cut-out cups, straws, or utensils as told by the health care provider.  Crush pills and put them in soft food such as pudding or ice cream. Some pills should not be crushed. Check with the health care provider before crushing any medicine. Contact a health care provider if:  The person has a feeding tube, and the feeding tube residual amount is too high.  The person has a fever.  The person tries to avoid food or water, such as refusing to eat, drink, or be fed, or is eating less than normal.  The person may have aspirated food or liquid.  You notice warning signs, such as choking or coughing, when the person eats or drinks. Get help right away if:  The person has trouble breathing or starts to breathe quickly.  The person is breathing very slowly or stops breathing.  The person coughs a lot after eating or drinking.  The person has a long-lasting (chronic) cough.  The person coughs up thick, yellow, or tan sputum.  If someone is choking on food or an object, perform the Heimlich maneuver (abdominal thrusts).  The person has symptoms of pneumonia, such as:  Coughing a lot.  Coughing up mucus with a  bad smell or blood in it.  Feeling short of breath.  Complaining of chest pain.  Sweating, fever, and chills.  Feeling tired.  Complaining of trouble breathing.  Wheezing.  The person cannot stop choking.  The person is unable to breathe, turns blue, faints, or seems confused. These symptoms may represent a serious problem that is an emergency. Do not wait to see if the symptoms will go away. Get medical help right away. Call your local emergency services (911 in the [place]  Summary  Aspiration is the breathing in (inhalation) of a liquid or object into the lungs. Things that can be inhaled into the lungs include food, liquids, saliva, or stomach contents.  Aspiration can cause pneumonia or choking.  One sign of aspiration is coughing after swallowing food or liquids.  Contact a health care provider if you notice signs of aspiration. This information is not intended to replace advice given to you by your health care provider. Make sure you discuss any questions you have with your health care provider. Document Released: 11/20/2010 Document Revised: 07/15/2016 Document Reviewed: 07/15/2016 Elsevier Interactive Patient Education  2017 [employer].

## 2017-03-30 NOTE — Discharge Summary (Signed)
[facility] NAME: Joel Davis    MR#:  [id number]  DATE OF BIRTH:  1951/01/27  DATE OF ADMISSION:  03/25/2017 ADMITTING PHYSICIAN: Hillary Bow, MD  DATE OF DISCHARGE: 03/30/2017  PRIMARY CARE PHYSICIAN: Alvester Morin, MD    ADMISSION DIAGNOSIS:  Obtundation [R40.1] Elbow pain [M25.529] Hematoma [T14.8XXA] Fall, initial encounter [id number]]  DISCHARGE DIAGNOSIS:  Active Problems:   UTI (urinary tract infection)   Dementia without behavioral disturbance   Dysphagia   Goals of care, counseling/discussion   Palliative care encounter   SECONDARY DIAGNOSIS:   Past Medical History:  Diagnosis Date  . Anxiety   . Atrial flutter ([place])   . Basal cell carcinoma   . Cognitive communication deficit   . Coronary artery disease   . Dementia   . DM (diabetes mellitus) ([place])   . Hypertension   . Insomnia     HOSPITAL COURSE:   1.  Failure to thrive, moderate malnutrition, dysphagia with chronic aspiration. The patient failed swallow evaluation. The patient is currently on dysphagia 1 diet with pudding thickened liquids. The patient will likely aspirate on this. Since the patient has dementia, the patient is not a candidate for a PEG tube. Patient was seen by palliative care here at the hospital. Patient is a candidate for hospice and comfort care measures when he declines. Patient made a DO NOT RESUSCITATE yesterday with patient's sister. Case discussed with care manager. [facility] with hospice. 2. Acute encephalopathy with urinary tract infection. MRI of the brain negative for acute stroke. Urine culture growing Escherichia coli sensitive to Rocephin. Switch antibiotics to Keflex upon going back to facility. Will give Keflex for another 4 days. 3. Bradycardia. Metoprolol decreased down to 12.5 mg twice a day 4. Essential hypertension. Blood pressure was on the lower side  and lisinopril was held yesterday but can be restarted today since the blood pressure is higher. Continue lower dose metoprolol. 5. Hyperlipidemia unspecified on atorvastatin 6. Dementia without behavioral disturbance 7. History of atrial flutter. Aspirin only for anticoagulation  DISCHARGE CONDITIONS:   Guarded  CONSULTS OBTAINED:  Palliative care  DRUG ALLERGIES:   Allergies  Allergen Reactions  . Penicillins     DISCHARGE MEDICATIONS:   Current Discharge Medication List    START taking these medications   Details  cephALEXin (KEFLEX) 250 MG capsule Take 1 capsule (250 mg total) by mouth 3 (three) times daily. Qty: 12 capsule, Refills: 0      CONTINUE these medications which have CHANGED   Details  metoprolol tartrate (LOPRESSOR) 25 MG tablet Take 0.5 tablets (12.5 mg total) by mouth 2 (two) times daily. Qty: 30 tablet, Refills: 0    oxyCODONE (OXY IR/ROXICODONE) 5 MG immediate release tablet Take 1 tablet (5 mg total) by mouth every 4 (four) hours as needed for severe pain. Qty: 15 tablet, Refills: 0      CONTINUE these medications which have NOT CHANGED   Details  aspirin EC 81 MG tablet Take 81 mg by mouth daily.    atorvastatin (LIPITOR) 80 MG tablet Take 80 mg by mouth daily.    chlorhexidine (PERIDEX) 0.12 % solution Use as directed 15 mLs in the mouth or throat daily.    citalopram (CELEXA) 20 MG tablet Take 20 mg by mouth daily.    lisinopril (PRINIVIL,ZESTRIL) 40 MG tablet Take 40 mg by mouth daily.    sennosides-docusate sodium (SENOKOT-S) 8.6-50  MG tablet Take 1 tablet by mouth daily.    vitamin B-12 (CYANOCOBALAMIN) 500 MCG tablet Take 500 mcg by mouth daily.         DISCHARGE INSTRUCTIONS:   Follow-up with Dr. at rehabilitation one day Follow-up with hospice at facility  If you experience worsening of your admission symptoms, develop shortness of breath, life threatening emergency, suicidal or homicidal thoughts you must seek medical  attention immediately by calling 911 or calling your MD immediately  if symptoms less severe.  You Must read complete instructions/literature along with all the possible adverse reactions/side effects for all the Medicines you take and that have been prescribed to you. Take any new Medicines after you have completely understood and accept all the possible adverse reactions/side effects.   Please note  You were cared for by a hospitalist during your hospital stay. If you have any questions about your discharge medications or the care you received while you were in the hospital after you are discharged, you can call the unit and asked to speak with the hospitalist on call if the hospitalist that took care of you is not available. Once you are discharged, your primary care physician will handle any further medical issues. Please note that NO REFILLS for any discharge medications will be authorized once you are discharged, as it is imperative that you return to your primary care physician (or establish a relationship with a primary care physician if you do not have one) for your aftercare needs so that they can reassess your need for medications and monitor your lab values.    Today   CHIEF COMPLAINT:   Chief Complaint  Patient presents with  . Fall    HISTORY OF PRESENT ILLNESS:  Joel Davis  is a 66 y.o. male presented after a fall. Found to have urinary tract infection and acute encephalopathy.   VITAL SIGNS:  Blood pressure (!) 162/53, pulse 66, temperature 97.7 F (36.5 C), temperature source Axillary, resp. rate 20, height 5\' 8"  (1.727 m), weight 77.2 kg (170 lb 3.2 oz), SpO2 92 %.    PHYSICAL EXAMINATION:  GENERAL:  66 y.o.-year-old patient lying in the bed with no acute distress.  EYES: Pupils equal, round, reactive to light and accommodation. No scleral icterus. Extraocular muscles intact.  HEENT: Head atraumatic, normocephalic. Oropharynx and nasopharynx clear.  NECK:   Supple, no jugular venous distention. No thyroid enlargement, no tenderness.  LUNGS: Decreased breath sounds bilateral bases, no wheezing, rales,rhonchi or crepitation. No use of accessory muscles of respiration.  CARDIOVASCULAR: S1, S2 normal. 2/6 systolic murmurs. No rubs, or gallops.  ABDOMEN: Soft, non-tender, non-distended. Bowel sounds present. No organomegaly or mass.  EXTREMITIES: No pedal edema, cyanosis, or clubbing.  NEUROLOGIC:  Gait noPatient able to move his extremities on his ownt checked.  PSYCHIATRIC: The patient is alert.  SKIN: Bruising around left eye and left forehead  DATA REVIEW:   CBC  Recent Labs Lab 03/26/17 0423  WBC 7.8  HGB 11.5*  HCT 34.6*  PLT 228    Chemistries   Recent Labs Lab 03/25/17 1229  03/28/17 0718  NA 139  < > 141  K 4.1  < > 3.7  CL 105  < > 111  CO2 26  < > 26  GLUCOSE 139*  < > 130*  BUN 23*  < > 16  CREATININE 1.37*  < > 1.16  CALCIUM 9.1  < > 8.3*  MG 1.9  --   --   AST 25  --   --  ALT 15*  --   --   ALKPHOS 81  --   --   BILITOT 0.5  --   --   < > = values in this interval not displayed.   Microbiology Results  Results for orders placed or performed during the hospital encounter of 03/25/17  Urine culture     Status: Abnormal   Collection Time: 03/25/17  4:47 PM  Result Value Ref Range Status   Specimen Description URINE, RANDOM  Final   Special Requests NONE  Final   Culture >=100,000 COLONIES/mL ESCHERICHIA COLI (A)  Final   Report Status 03/29/2017 FINAL  Final   Organism ID, Bacteria ESCHERICHIA COLI (A)  Final      Susceptibility   Escherichia coli - MIC*    AMPICILLIN >=32 RESISTANT Resistant     CEFAZOLIN 16 SENSITIVE Sensitive     CEFTRIAXONE <=1 SENSITIVE Sensitive     CIPROFLOXACIN >=4 RESISTANT Resistant     GENTAMICIN <=1 SENSITIVE Sensitive     IMIPENEM <=0.25 SENSITIVE Sensitive     NITROFURANTOIN <=16 SENSITIVE Sensitive     TRIMETH/SULFA <=20 SENSITIVE Sensitive     AMPICILLIN/SULBACTAM  >=32 RESISTANT Resistant     PIP/TAZO <=4 SENSITIVE Sensitive     Extended ESBL NEGATIVE Sensitive     * >=100,000 COLONIES/mL ESCHERICHIA COLI  MRSA PCR Screening     Status: None   Collection Time: 03/25/17  8:21 PM  Result Value Ref Range Status   MRSA by PCR NEGATIVE NEGATIVE Final    Comment:        The GeneXpert MRSA Assay (FDA approved for NASAL specimens only), is one component of a comprehensive MRSA colonization surveillance program. It is not intended to diagnose MRSA infection nor to guide or monitor treatment for MRSA infections.     RADIOLOGY:  Dg Abd 1 View  Result Date: 03/29/2017 CLINICAL DATA:  Metal screening prior to MRI EXAM: ABDOMEN - 1 VIEW COMPARISON:  None. FINDINGS: Nonobstructive bowel gas pattern. Vascular calcifications. Degenerative change of the lumbar spine. No radiopaque foreign body is seen. IMPRESSION: No radiopaque foreign body is seen. Electronically Signed   By: Julian Hy M.D.   On: 03/29/2017 09:34   Mr Brain Wo Contrast  Result Date: 03/29/2017 CLINICAL DATA:  66 year old male with worsening mental status, increasingly lethargic. Newly nonverbal. EXAM: MRI HEAD WITHOUT CONTRAST TECHNIQUE: Multiplanar, multiecho pulse sequences of the brain and surrounding structures were obtained without intravenous contrast. COMPARISON:  Head and cervical spine CT 03/25/2017 and earlier. FINDINGS: Brain: Study is intermittently degraded by motion artifact despite repeated imaging attempts. Large area of chronic encephalomalacia in the right PCA territory (including right mesial temporal lobe involvement) with regional white matter gliosis and some dystrophic calcification. Ex vacuo enlargement of the posterior right lateral ventricle. There is also chronic encephalomalacia in the left frontal operculum tracking towards the superior peri-Rolandic cortex. This encephalomalacia was present on a February 2018 head CT. There is mildly heterogeneous diffusion  signal here (series 100, image 32), but this appears to be related to laminar necrosis rather than acute ischemia. Regional white matter gliosis here also. Chronic lacunar infarcts in the medial thalami I and basal ganglia to a lesser degree. Patchy encephalomalacia in the left cerebellar peduncle. No convincing restricted diffusion to suggest acute infarction. No midline shift, mass effect, evidence of mass lesion, ventriculomegaly, extra-axial collection or acute intracranial hemorrhage. Cervicomedullary junction and pituitary are within normal limits. Vascular: Major intracranial vascular flow voids are preserved. Skull and  upper cervical spine: Negative. Sinuses/Orbits: Negative. Other: Visible internal auditory structures appear normal. Negative scalp soft tissues. IMPRESSION: 1. Intermittently degraded by motion. No convincing acute intracranial abnormality. 2. Chronic encephalomalacia in the middle left MCA territory, throughout the right PCA territory, and at the left middle cerebellar peduncle appears to be post ischemic in nature. Superimposed chronic lacunar infarcts in the deep gray matter nuclei. Electronically Signed   By: Genevie Ann M.D.   On: 03/29/2017 10:26       Management plans discussed with the patient, and he is in agreement. Spoke with sister yesterday and left message for her today.  CODE STATUS:     Code Status Orders        Start     Ordered   03/29/17 1146  Do not attempt resuscitation (DNR)  Continuous    Question Answer Comment  In the event of cardiac or respiratory ARREST Do not call a "code blue"   In the event of cardiac or respiratory ARREST Do not perform Intubation, CPR, defibrillation or ACLS   In the event of cardiac or respiratory ARREST Use medication by any route, position, wound care, and other measures to relive pain and suffering. May use oxygen, suction and manual treatment of airway obstruction as needed for comfort.   Comments nurse may pronounce       03/29/17 1145    Code Status History    Date Active Date Inactive Code Status Order ID Comments User Context   03/25/2017  5:43 PM 03/29/2017 11:45 AM Full Code [id number]  Hillary Bow, MD ED   12/16/2016  6:08 PM 12/19/2016  6:51 PM Full Code [id number]  Gladstone Lighter, MD Inpatient      TOTAL TIME TAKING CARE OF THIS PATIENT: 35 minutes.    Loletha Grayer M.D on 03/30/2017 at 10:21 AM  Between 7am to 6pm - Pager - [phone]  After 6pm go to [email or web address] - password [email or web address] [facility]  [phone]  CC: Primary care physician; Alvester Morin, MD

## 2017-09-01 DEATH — deceased

## 2018-08-24 IMAGING — CT CT CERVICAL SPINE W/O CM
5 of 8 series · 14 of 33 positions shown, 15 images · non-contrast
Comparison: 12/16/2016 CT head.

CLINICAL DATA: 66 y/o M; unwitnessed fall. Hematoma on the left
forehead.

EXAM:
CT HEAD WITHOUT CONTRAST
CT CERVICAL SPINE WITHOUT CONTRAST
TECHNIQUE: Multidetector CT imaging of the head and cervical spine was
performed following the standard protocol without intravenous
contrast. Multiplanar CT image reconstructions of the cervical spine
were also generated.

[Series 3: head bone · axial · 0.41mm/px · z∈[-44,+10]mm · 2 of 81 slices shown]
[im 27/81  bone]
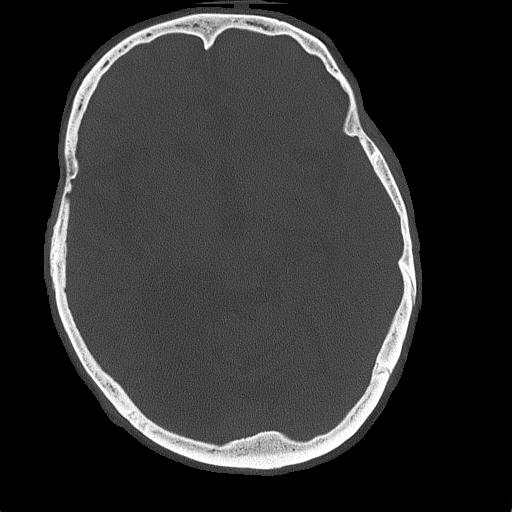
[im 54/81  bone]
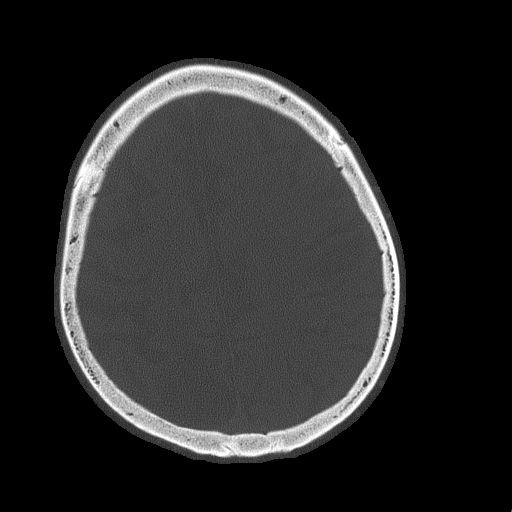

[Series 9: c_spine bone · axial · 0.35mm/px · z∈[-196,-142]mm · 2 of 83 slices shown]
[im 28/83  bone]
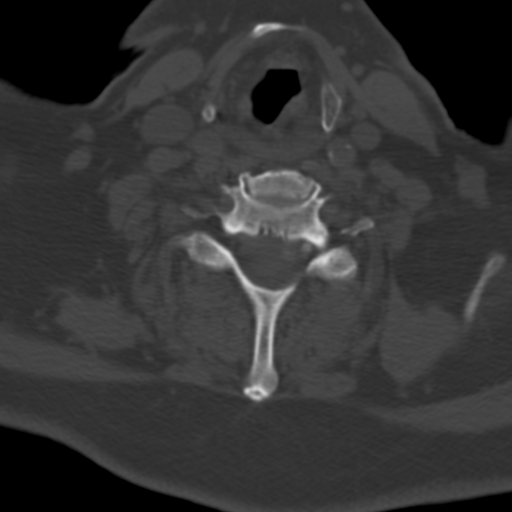
[im 55/83  bone]
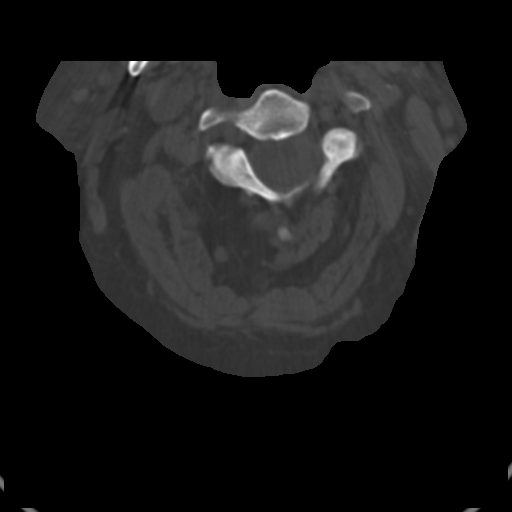

[Series 10: coronal soft tissue · coronal · 0.35mm/px · 2 of 77 slices shown]
[im 26/77  bone]
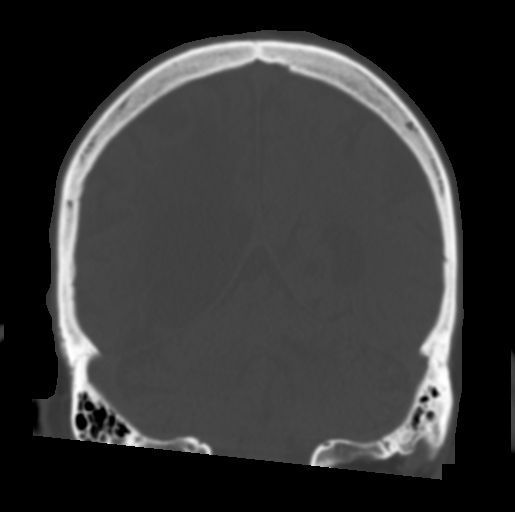
[im 51/77  bone]
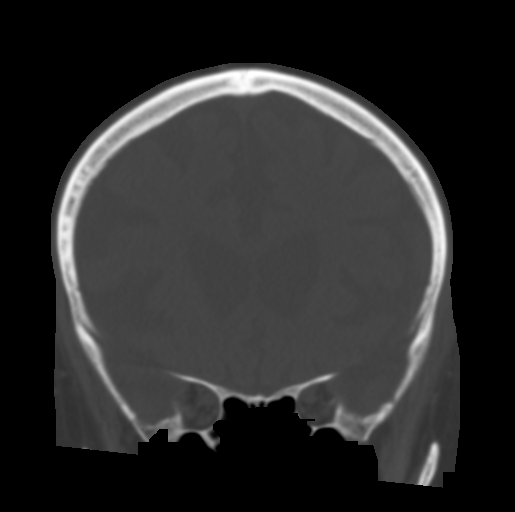

[Series 12: c_spine sag bone · sagittal · 0.39mm/px · 5 of 90 slices shown]
[im 13/90  bone]
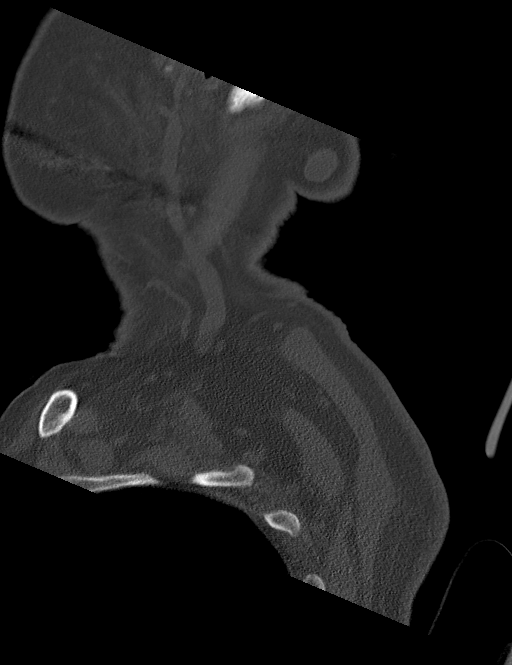
[im 26/90  bone]
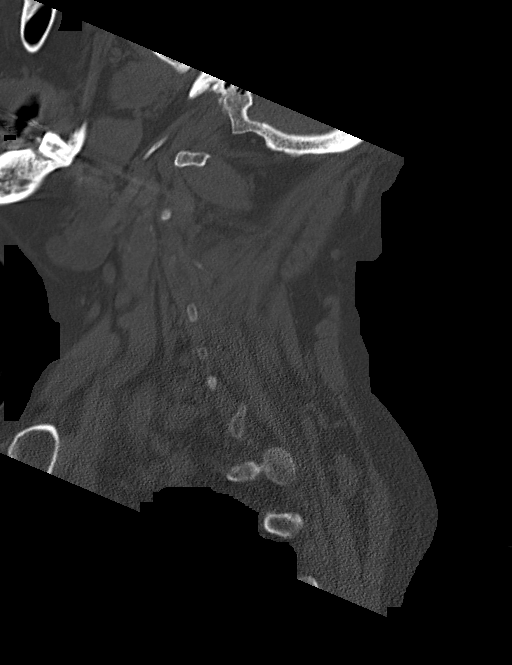
[im 39/90  bone]
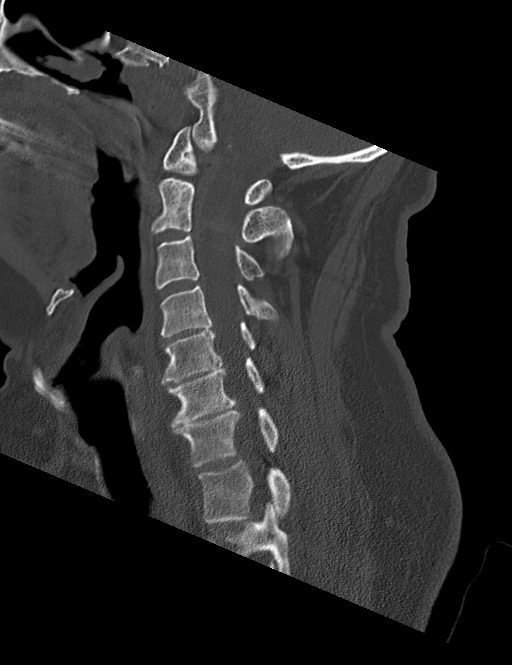
[im 51/90  bone]
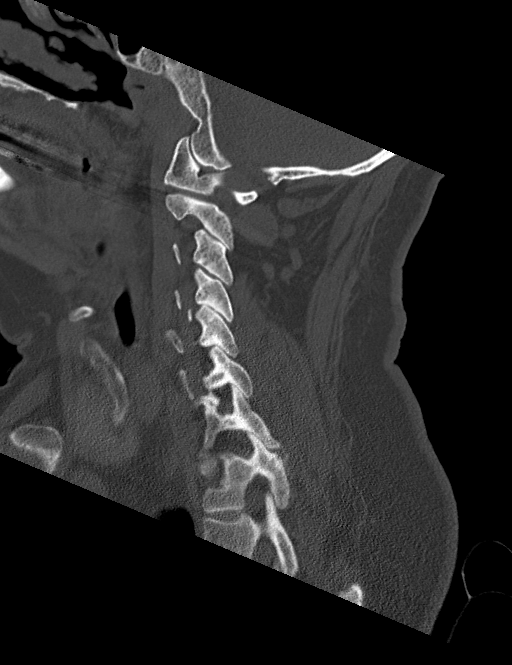
[im 64/90  bone]
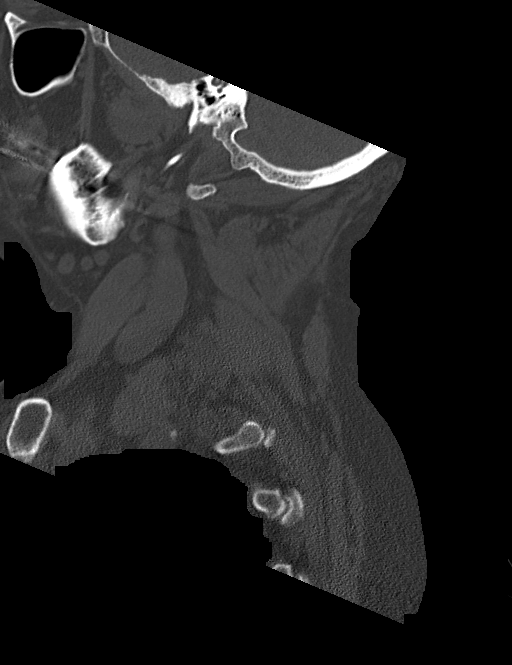

[Series 14: c_spine orth bone · axial · 0.31mm/px · z∈[-262,-162]mm · 3 of 115 slices shown, 4 images]
[im 29/115  soft-tissue]
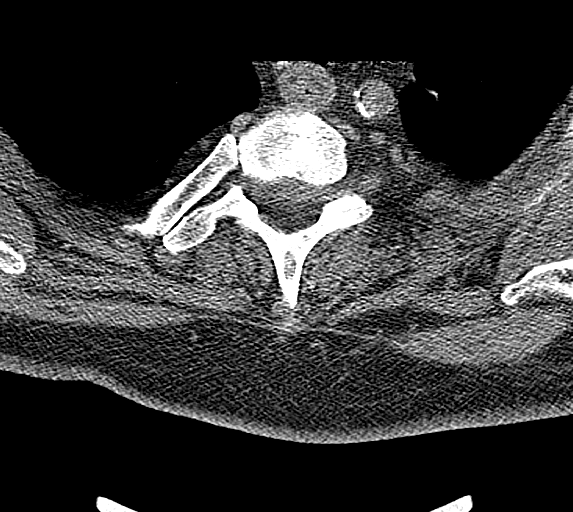
[im 29/115  bone]
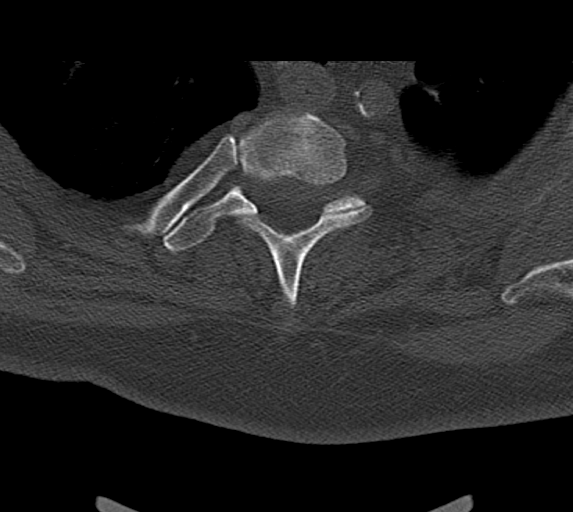
[im 58/115  bone]
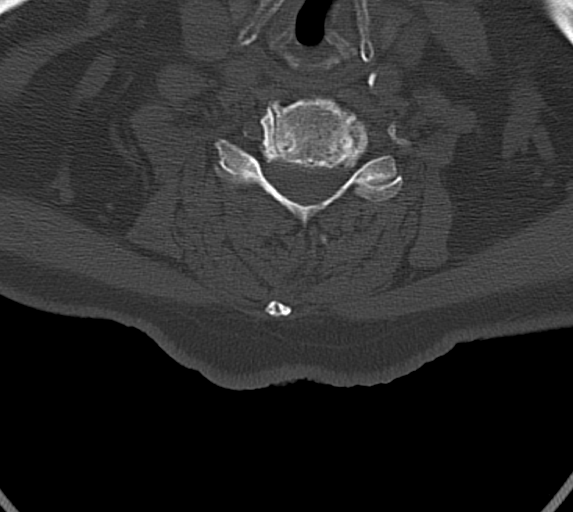
[im 86/115  bone]
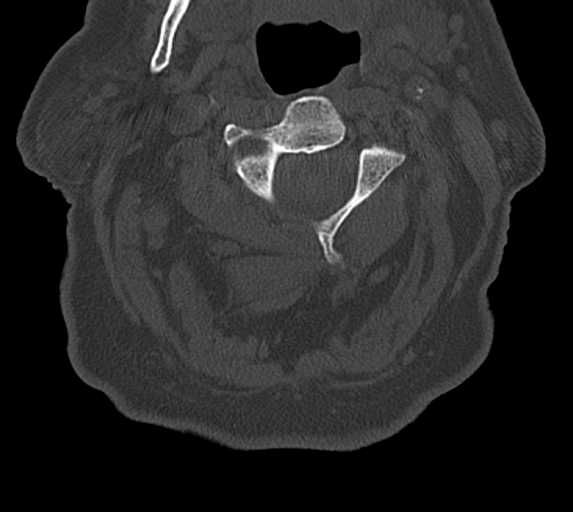

[14 of 33 positions shown; findings below may reference images not displayed]

FINDINGS: CT HEAD FINDINGS

Brain: Chronic right PCA infarction in chronic lacunar infarctions
in bilateral thalamus, lentiform nuclei, and left caudate head.
Small chronic infarction in left lateral frontal lobe. No evidence
for large territory infarct, acute intracranial hemorrhage, mass
effect, or hydrocephalus.

Vascular: Extensive calcific atherosclerosis of carotid siphons and
vertebrobasilar system.

Skull: The left frontal scalp contusion. No displaced calvarial
fracture.

Sinuses/Orbits: No acute finding.

Other: Left suboccipital subcutaneous stable dermal cyst.

CT CERVICAL SPINE FINDINGS

Alignment: Straightening of cervical lordosis.  No listhesis.

Skull base and vertebrae: No acute fracture. No primary bone lesion
or focal pathologic process.

Soft tissues and spinal canal: No prevertebral fluid or swelling. No
visible canal hematoma.

Disc levels: Cervical spondylosis with discogenic degenerative
changes greatest at the C5 through C7 levels or uncovertebral and
facet hypertrophy contribute to mild to moderate bony foraminal
narrowing. No high-grade canal stenosis.

Upper chest: Negative.

Other: Right greater than left carotid bifurcation calcifications.
IMPRESSION: 1. Left frontal scalp hematoma.  No displaced calvarial fracture.
2. No acute intracranial abnormality identified.
3. Multiple stable chronic infarctions as above.
4. No acute fracture or dislocation of cervical spine.
5. Cervical spondylosis greatest at C5 through C7 levels. No
high-grade canal stenosis.

By: Attila Chummy Eszik M.D.

## 2018-08-24 IMAGING — DX DG CHEST 1V PORT
1 series · 1 of 1 positions shown · non-contrast
Comparison: 12/16/2016

CLINICAL DATA: Altered mental status with unwitnessed fall

EXAM:
PORTABLE CHEST 1 VIEW

[chest ap]
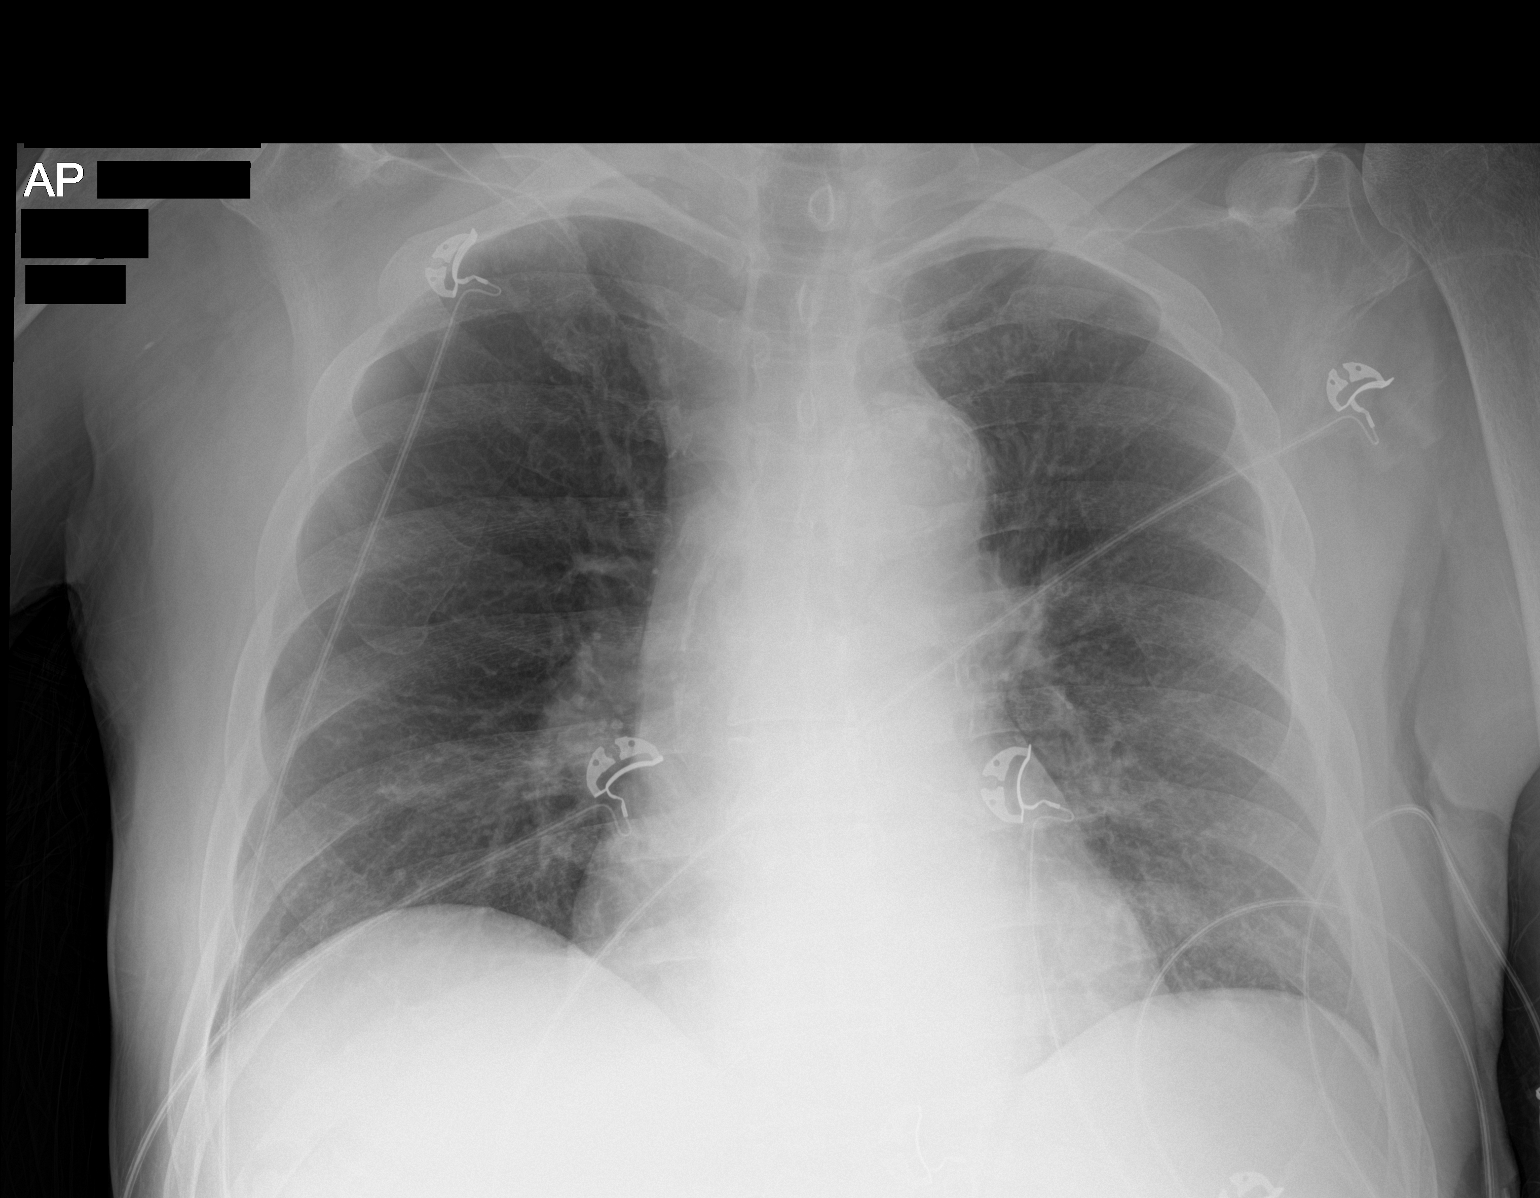

[1 of 1 positions shown; findings below may reference images not displayed]

FINDINGS: No acute pulmonary infiltrate, pleural effusion, or pneumothorax.
Stable cardiomediastinal silhouette with atherosclerosis. Old
appearing left fifth rib deformity.
IMPRESSION: No active disease.
